# Patient Record
Sex: Female | Born: 1975 | Race: White | Hispanic: No | Marital: Married | Smoking: Never smoker
Health system: Southern US, Community
[De-identification: ages and names within clinical notes are randomized; demographics above are authoritative.]

## PROBLEM LIST (undated history)

## (undated) ENCOUNTER — Emergency Department (HOSPITAL_COMMUNITY): Payer: Self-pay

## (undated) DIAGNOSIS — D219 Benign neoplasm of connective and other soft tissue, unspecified: Secondary | ICD-10-CM

## (undated) DIAGNOSIS — I499 Cardiac arrhythmia, unspecified: Secondary | ICD-10-CM

## (undated) DIAGNOSIS — A64 Unspecified sexually transmitted disease: Secondary | ICD-10-CM

## (undated) DIAGNOSIS — M797 Fibromyalgia: Secondary | ICD-10-CM

## (undated) DIAGNOSIS — L509 Urticaria, unspecified: Secondary | ICD-10-CM

## (undated) DIAGNOSIS — R519 Headache, unspecified: Secondary | ICD-10-CM

## (undated) DIAGNOSIS — N946 Dysmenorrhea, unspecified: Secondary | ICD-10-CM

## (undated) DIAGNOSIS — G8929 Other chronic pain: Secondary | ICD-10-CM

## (undated) HISTORY — DX: Fibromyalgia: M79.7

## (undated) HISTORY — DX: Unspecified sexually transmitted disease: A64

## (undated) HISTORY — DX: Benign neoplasm of connective and other soft tissue, unspecified: D21.9

## (undated) HISTORY — DX: Cardiac arrhythmia, unspecified: I49.9

## (undated) HISTORY — DX: Dysmenorrhea, unspecified: N94.6

## (undated) HISTORY — PX: TUBAL LIGATION: SHX77

## (undated) HISTORY — PX: BREAST BIOPSY: SHX20

## (undated) HISTORY — DX: Urticaria, unspecified: L50.9

---

## 2001-09-21 ENCOUNTER — Inpatient Hospital Stay (HOSPITAL_COMMUNITY): Admission: AD | Admit: 2001-09-21 | Discharge: 2001-09-21 | Payer: Self-pay | Admitting: Obstetrics and Gynecology

## 2001-11-17 ENCOUNTER — Inpatient Hospital Stay (HOSPITAL_COMMUNITY): Admission: AD | Admit: 2001-11-17 | Discharge: 2001-11-20 | Payer: Self-pay | Admitting: Obstetrics and Gynecology

## 2001-12-29 ENCOUNTER — Other Ambulatory Visit: Admission: RE | Admit: 2001-12-29 | Discharge: 2001-12-29 | Payer: Self-pay | Admitting: Obstetrics and Gynecology

## 2003-04-09 ENCOUNTER — Emergency Department (HOSPITAL_COMMUNITY): Admission: EM | Admit: 2003-04-09 | Discharge: 2003-04-09 | Payer: Self-pay

## 2008-12-09 DIAGNOSIS — Z8742 Personal history of other diseases of the female genital tract: Secondary | ICD-10-CM

## 2008-12-09 HISTORY — DX: Personal history of other diseases of the female genital tract: Z87.42

## 2018-12-09 HISTORY — PX: OTHER SURGICAL HISTORY: SHX169

## 2020-12-28 ENCOUNTER — Other Ambulatory Visit: Payer: Self-pay

## 2020-12-28 ENCOUNTER — Ambulatory Visit (INDEPENDENT_AMBULATORY_CARE_PROVIDER_SITE_OTHER): Payer: BC Managed Care – PPO | Admitting: Obstetrics and Gynecology

## 2020-12-28 ENCOUNTER — Encounter: Payer: Self-pay | Admitting: Obstetrics and Gynecology

## 2020-12-28 VITALS — BP 122/82 | HR 82 | Ht 64.0 in | Wt 172.0 lb

## 2020-12-28 DIAGNOSIS — D259 Leiomyoma of uterus, unspecified: Secondary | ICD-10-CM

## 2020-12-28 NOTE — Patient Instructions (Addendum)
Total Laparoscopic Hysterectomy A total laparoscopic hysterectomy is a minimally invasive surgery to remove the uterus and cervix. The fallopian tubes and ovaries can also be removed during this surgery, if necessary. This procedure may be done to treat problems such as:  Growths in the uterus (uterine fibroids) that are not cancer but cause symptoms.  A condition that causes the lining of the uterus to grow in other areas (endometriosis).  Problems with pelvic support.  Cancer of the cervix, ovaries, uterus, or tissue that lines the uterus (endometrium).  Excessive bleeding in the uterus. After this procedure, you will no longer be able to have a baby, and you will no longer have a menstrual period. Tell a health care provider about:  Any allergies you have.  All medicines you are taking, including vitamins, herbs, eye drops, creams, and over-the-counter medicines.  Any problems you or family members have had with anesthetic medicines.  Any blood disorders you have.  Any surgeries you have had.  Any medical conditions you have.  Whether you are pregnant or may be pregnant. What are the risks? Generally, this is a safe procedure. However, problems may occur, including:  Infection.  Bleeding.  Blood clots in the legs or lungs.  Allergic reactions to medicines.  Damage to nearby structures or organs.  Having to change from this surgery to one in which a large incision is made in the abdomen (abdominal hysterectomy). What happens before the procedure? Staying hydrated Follow instructions from your health care provider about hydration, which may include:  Up to 2 hours before the procedure - you may continue to drink clear liquids, such as water, clear fruit juice, black coffee, and plain tea.   Eating and drinking restrictions Follow instructions from your health care provider about eating and drinking, which may include:  8 hours before the procedure - stop eating  heavy meals or foods, such as meat, fried foods, or fatty foods.  6 hours before the procedure - stop eating light meals or foods, such as toast or cereal.  6 hours before the procedure - stop drinking milk or drinks that contain milk.  2 hours before the procedure - stop drinking clear liquids. Medicines  Ask your health care provider about: ? Changing or stopping your regular medicines. This is especially important if you are taking diabetes medicines or blood thinners. ? Taking medicines such as aspirin and ibuprofen. These medicines can thin your blood. Do not take these medicines unless your health care provider tells you to take them. ? Taking over-the-counter medicines, vitamins, herbs, and supplements.  You may be asked to take medicine that helps you have a bowel movement (laxative) to prevent constipation. General instructions  If you were asked to do bowel preparation before the procedure, follow instructions from your health care provider.  This procedure can affect the way you feel about yourself. Talk with your health care provider about the physical and emotional changes hysterectomy may cause.  Do not use any products that contain nicotine or tobacco for at least 4 weeks before the procedure. These products include cigarettes, chewing tobacco, and vaping devices, such as e-cigarettes. If you need help quitting, ask your health care provider.  Plan to have a responsible adult take you home from the hospital or clinic.  Plan to have a responsible adult care for you for the time you are told after you leave the hospital or clinic. This is important. Surgery safety Ask your health care provider:  How your surgery  site will be marked.  What steps will be taken to help prevent infection. These may include: ? Removing hair at the surgery site. ? Washing skin with a germ-killing soap. ? Receiving antibiotic medicine. What happens during the procedure?  An IV will be  inserted into one of your veins.  You will be given one or more of the following: ? A medicine to help you relax (sedative). ? A medicine to make you fall asleep (general anesthetic). ? A medicine to numb the area (local anesthetic). ? A medicine that is injected into your spine to numb the area below and slightly above the injection site (spinal anesthetic). ? A medicine that is injected into an area of your body to numb everything below the injection site (regional anesthetic).  A gas will be used to inflate your abdomen. This will allow your surgeon to look inside your abdomen and do the surgery.  Three or four small incisions will be made in your abdomen.  A small device with a light (laparoscope) will be inserted into one of your incisions. Surgical instruments will be inserted through the other incisions in order to perform the procedure.  Your uterus and cervix may be removed through your vagina or cut into small pieces and removed through the small incisions. Any other organs that need to be removed will also be removed this way.  The gas will be released from inside your abdomen.  Your incisions will be closed with stitches (sutures), skin glue, or adhesive strips.  A bandage (dressing) may be placed over your incisions. The procedure may vary among health care providers and hospitals. What happens after the procedure?  Your blood pressure, heart rate, breathing rate, and blood oxygen level will be monitored until you leave the hospital or clinic.  You will be given medicine for pain as needed.  You will be encouraged to walk as soon as possible. You will also use a device to help you breathe or do breathing exercises to keep your lungs clear.  You may have to wear compression stockings. These stockings help to prevent blood clots and reduce swelling in your legs.  You will need to wear a sanitary pad for vaginal discharge or bleeding. Summary  Total laparoscopic  hysterectomy is a procedure to remove your uterus, cervix, and sometimes the fallopian tubes and ovaries.  This procedure can affect the way you feel about yourself. Talk with your health care provider about the physical and emotional changes hysterectomy may cause.  After this procedure, you will no longer be able to have a baby, and you will no longer have a menstrual period.  You will be given pain medicine to control discomfort after this procedure.  Plan to have a responsible adult take you home from the hospital or clinic. This information is not intended to replace advice given to you by your health care provider. Make sure you discuss any questions you have with your health care provider. Document Revised: 07/28/2020 Document Reviewed: 07/28/2020 Elsevier Patient Education  2021 Elsevier Inc. Uterine Fibroids  Uterine fibroids, also called leiomyomas, are noncancerous (benign) tumors that can grow in the uterus. They can cause heavy menstrual bleeding and pain. Fibroids may also grow in the fallopian tubes, cervix, or tissues (ligaments) near the uterus. You may have one or many fibroids. Fibroids vary in size, weight, and where they grow in the uterus. Some can become quite large. Most fibroids do not require medical treatment. What are the causes? The cause   of this condition is not known. What increases the risk? You are more likely to develop this condition if you:  Are in your 30s or 40s and have not gone through menopause.  Have a family history of this condition.  Are of African American descent.  Started your menstrual period at age 10 or younger.  Have never given birth.  Are overweight or obese. What are the signs or symptoms? Many women do not have any symptoms. Symptoms of this condition may include:  Heavy menstrual bleeding.  Bleeding between menstrual periods.  Pain and pressure in the pelvic area, between your hip bones.  Pain during sex.  Bladder  problems, such as needing to urinate right away or more often than usual.  Inability to have children (infertility).  Failure to carry pregnancy to term (miscarriage). How is this diagnosed? This condition may be diagnosed based on:  Your symptoms and medical history.  A physical exam.  A pelvic exam that includes feeling for any tumors.  Imaging tests, such as ultrasound or MRI. How is this treated? Treatment for this condition may include follow-up visits with your health care provider to monitor your fibroids for any changes. Other treatment may include:  Medicines, such as: ? Medicines to relieve pain, including aspirin and NSAIDs, such as ibuprofen or naproxen. ? Hormone therapy. Treatment may be given as a pill or an injection, or it may be inserted into the uterus using an intrauterine device (IUD).  Surgery that would do one of the following: ? Remove the fibroids (myomectomy). This may be recommended if fibroids affect your fertility and you want to become pregnant. ? Remove the uterus (hysterectomy). ? Block the blood supply to the fibroids (uterine artery embolization). This can cause them to shrink and die. Follow these instructions at home: Medicines  Take over-the-counter and prescription medicines only as told by your health care provider.  Ask your health care provider if you should take iron pills or eat more iron-rich foods, such as dark green, leafy vegetables. Heavy menstrual bleeding can cause low iron levels. Managing pain If directed, apply heat to your back or abdomen to reduce pain. Use the heat source that your health care provider recommends, such as a moist heat pack or a heating pad. To apply heat:  Place a towel between your skin and the heat source.  Leave the heat on for 20-30 minutes.  Remove the heat if your skin turns bright red. This is especially important if you are unable to feel pain, heat, or cold. You may have a greater risk of getting  burned.   General instructions  Pay close attention to your menstrual cycle. Tell your health care provider about any changes, such as: ? Heavier bleeding that requires you to change your pads or tampons more than usual. ? A change in the number of days that your menstrual period lasts. ? A change in symptoms that come with your menstrual period, such as back pain or cramps in your abdomen.  Keep all follow-up visits. This is important, especially if your fibroids need to be monitored for any changes. Contact a health care provider if you:  Have pelvic pain, back pain, or cramps in your abdomen that do not get better with medicine or heat.  Develop new bleeding between menstrual periods.  Have increased bleeding during or between menstrual periods.  Feel more tired or weak than usual.  Feel light-headed. Get help right away if you:  Faint.  Have pelvic   pain that suddenly gets worse.  Have severe vaginal bleeding that soaks a tampon or pad in 30 minutes or less. Summary  Uterine fibroids are noncancerous (benign) tumors that can develop in the uterus.  The exact cause of this condition is not known.  Most fibroids do not require medical treatment unless they affect your ability to have children (fertility).  Contact a health care provider if you have pelvic pain, back pain, or cramps in your abdomen that do not get better with medicines.  Get help right away if you faint, have pelvic pain that suddenly gets worse, or have severe vaginal bleeding. This information is not intended to replace advice given to you by your health care provider. Make sure you discuss any questions you have with your health care provider. Document Revised: 06/27/2020 Document Reviewed: 06/27/2020 Elsevier Patient Education  2021 Elsevier Inc.  

## 2020-12-28 NOTE — Progress Notes (Signed)
GYNECOLOGY  VISIT   HPI: 45 y.o.   Married  Caucasian  female   G3P3 with Patient's last menstrual period was 11/30/2020 (approximate).   here for patient here for heavy menstrual cycles and to evaluate fibroids.  She has a CT scan showing fibroids.  CT ordered for gastrointestinal issues.  Had a negative EMB. States they feel like they are getting bigger.  She has abdominal sensitivity extending into her back, bladder, rectum.   Menses monthly, heavy, cramping, and last 7 days. No intermenstrual bleeding.  No history of anemia.  Declines future childbearing. She was scheduled for a hysterectomy in [place] but moved to [place]. Still considering this surgery.  GYNECOLOGIC HISTORY: Patient's last menstrual period was 11/30/2020 (approximate). Contraception:  tubal Menopausal hormone therapy: n/a Last mammogram: 10-12 years ago had abnormal MMG in [place] with BX showing calcifications.  Mammogram in Jan or Feb, 2021, and this was normal. Last pap smear: 2020 normal and negative HR HPV - per patient. 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          OB History    Gravida  3   Para  3   Term      Preterm      AB      Living  3     SAB      IAB      Ectopic      Multiple      Live Births                 There are no problems to display for this patient.   Past Medical History:  Diagnosis Date  . Dysmenorrhea   . Fibroid   . Fibromyalgia   . History of abnormal cervical Pap smear 2010   hx of colposcopy--no treatment  . Irregular heart rhythm    takes propranolol  . STD (sexually transmitted disease)    Hx of HPV    Past Surgical History:  Procedure Laterality Date  . COLPOSCOPY    . TUBAL LIGATION      Current Outpatient Medications  Medication Sig Dispense Refill  . gabapentin (NEURONTIN) 400 MG capsule Take 400 mg by mouth daily.    . propranolol (INDERAL) 20 MG tablet Take 20 mg by mouth 3 (three) times daily.     No  current facility-administered medications for this visit.     ALLERGIES: Patient has no known allergies.  Family History  Problem Relation Age of Onset  . Diabetes Maternal Grandfather   . Stroke Paternal Grandfather     Social History   Socioeconomic History  . Marital status: Married    Spouse name: Not on file  . Number of children: Not on file  . Years of education: Not on file  . Highest education level: Not on file  Occupational History  . Not on file  Tobacco Use  . Smoking status: Never Smoker  . Smokeless tobacco: Never Used  Vaping Use  . Vaping Use: Never used  Substance and Sexual Activity  . Alcohol use: Not Currently  . Drug use: Never  . Sexual activity: Yes    Birth control/protection: Surgical    Comment: Tubal`  Other Topics Concern  . Not on file  Social History Narrative  . Not on file   Social Determinants of Health   Financial Resource Strain: Not on file  Food Insecurity: Not on file  Transportation Needs: Not on file  Physical  Activity: Not on file  Stress: Not on file  Social Connections: Not on file  Intimate Partner Violence: Not on file    Review of Systems  All other systems reviewed and are negative.   PHYSICAL EXAMINATION:    BP 122/82   Pulse 82   Ht 5\' 4"  (1.626 m)   Wt 172 lb (78 kg)   LMP 11/30/2020 (Approximate)   SpO2 98%   BMI 29.52 kg/m     General appearance: alert, cooperative and appears stated age Head: Normocephalic, without obvious abnormality, atraumatic Lungs: clear to auscultation bilaterally Heart: regular rate and rhythm Abdomen: soft, non-tender,16 week size mass.  Extremities: extremities normal, atraumatic, no cyanosis or edema Skin: Skin color, texture, turgor normal. No rashes or lesions No abnormal inguinal nodes palpated Neurologic: Grossly normal  Pelvic: External genitalia:  no lesions              Urethra:  normal appearing urethra with no masses, tenderness or lesions               Bartholins and Skenes: normal                 Vagina: normal appearing vagina with normal color and discharge, no lesions              Cervix: no lesions                Bimanual Exam:  Uterus:  16 week size uterus, nontender.               Adnexa: no mass, fullness, tenderness              Rectal exam: Yes.  .  Confirms.              Anus:  normal sphincter tone, no lesions  Chaperone was present for exam.  ASSESSMENT  Fibroid uterus.  Hx prior positive HR HPV.  Last pap normal.  Status post BTL.  PLAN  We discussed fibroids and treatment options - Medical therapy to control cycles, uterine artery embolization, laparoscopic hysterectomy.  Will get a copy of her CT scan, last pap and endometrial biopsy.  Return for pelvic ultrasound.

## 2021-01-12 ENCOUNTER — Other Ambulatory Visit: Payer: Self-pay | Admitting: *Deleted

## 2021-01-12 DIAGNOSIS — D259 Leiomyoma of uterus, unspecified: Secondary | ICD-10-CM

## 2021-02-08 ENCOUNTER — Other Ambulatory Visit: Payer: BC Managed Care – PPO

## 2021-02-08 ENCOUNTER — Other Ambulatory Visit: Payer: BC Managed Care – PPO | Admitting: Obstetrics and Gynecology

## 2021-02-27 NOTE — Progress Notes (Signed)
GYNECOLOGY  VISIT   HPI: 45 y.o.   Married  Caucasian  female   G3P3 with Patient's last menstrual period was 02/18/2021 (exact date).   here for pelvic ultrasound follow up of fibroid and menorrhagia with regular menses and dysmenorrhea.  CT scan 06/04/19 showing 5.3 cm fibroid. [facility] EMB 11/29/19 benign proliferative endometirum with no atypia of hyperplasia. Peak Enigma.  Uterus felt 16 week size on exam on 12/28/20 in office here.   Feels tired all the time. States she does has fibromyalgia.  No dx of anemia.  Declines future childbearing.  Wants hysterectomy.   GYNECOLOGIC HISTORY: Patient's last menstrual period was 02/18/2021 (exact date). Contraception:  Tubal Menopausal hormone therapy:  n/a Last mammogram:  10-12 years ago had abnormal MMG in [place] with BX showing calcifications.  Mammogram in Jan or Feb, 2021, and this was normal. Last pap smear:   2020 normal and negative HR HPV - per patient. 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          OB History    Gravida  3   Para  3   Term      Preterm      AB      Living  3     SAB      IAB      Ectopic      Multiple      Live Births                 There are no problems to display for this patient.   Past Medical History:  Diagnosis Date  . Dysmenorrhea   . Fibroid   . Fibromyalgia   . History of abnormal cervical Pap smear 2010   hx of colposcopy--no treatment  . Irregular heart rhythm    takes propranolol  . STD (sexually transmitted disease)    Hx of HPV    Past Surgical History:  Procedure Laterality Date  . COLPOSCOPY    . TUBAL LIGATION      Current Outpatient Medications  Medication Sig Dispense Refill  . gabapentin (NEURONTIN) 400 MG capsule Take 400 mg by mouth daily.    . propranolol (INDERAL) 20 MG tablet Take 20 mg by mouth 3 (three) times daily.     No current facility-administered medications for this visit.      ALLERGIES: Patient has no known allergies.  Family History  Problem Relation Age of Onset  . Diabetes Maternal Grandfather   . Stroke Paternal Grandfather     Social History   Socioeconomic History  . Marital status: Married    Spouse name: Not on file  . Number of children: Not on file  . Years of education: Not on file  . Highest education level: Not on file  Occupational History  . Not on file  Tobacco Use  . Smoking status: Never Smoker  . Smokeless tobacco: Never Used  Vaping Use  . Vaping Use: Never used  Substance and Sexual Activity  . Alcohol use: Not Currently  . Drug use: Never  . Sexual activity: Yes    Birth control/protection: Surgical    Comment: Tubal`  Other Topics Concern  . Not on file  Social History Narrative  . Not on file   Social Determinants of Health   Financial Resource Strain: Not on file  Food Insecurity: Not on file  Transportation Needs: Not on file  Physical Activity: Not on  file  Stress: Not on file  Social Connections: Not on file  Intimate Partner Violence: Not on file    Review of Systems  All other systems reviewed and are negative.   PHYSICAL EXAMINATION:    BP 108/70 (Cuff Size: Large)   Pulse 75   Ht 5\' 4"  (1.626 m)   Wt 172 lb (78 kg)   LMP 02/18/2021 (Exact Date)   SpO2 98%   BMI 29.52 kg/m     General appearance: alert, cooperative and appears stated age  Pelvic US Uterus retroverted. Fibroids - 6.53 cm left upper, 1.72 cm, and 0.81 cm.  EMS 8.63 mm. Ovaries normal with normal follicle pattern.  No adnexal masses.  No free fluid.   ASSESSMENT  Fibroids.  Fatigue.    PLAN  US findings and images discussed.   CBC and TSH.   Fibroids treatment options reviewed - hormonal contraceptives, Freida Busman, Depo Lupron, hysteroscopic and laparoscopic ultrasound guided ablation, myomectomy, uterine artery embolization and hysterectomy reviewed.  I discussed total laparoscopic hysterectomy with bilateral  salpingectomy and possible bilateral oophorectomy (if abnormal), vaginal morcellation of uterus in an Alexis bag, and cystoscopy.   I reviewed risks, benefits, and alternatives.  Risks include but are not limited to bleeding, infection, damage to surrounding organs, pneumonia, reaction to anesthesia, DVT, PE, death, need for reoperation, hernia formation, vaginal cuff dehiscence, neuropathy, need to convert to a traditional laparotomy incision to complete the procedure. Surgical expectations and recovery discussed.  She will receive Lovenox for DVT/PE prophylaxis. Patient wishes to proceed.  Questions invited and answered.  ACOG HO on hysterectomy.   She will update her mammogram.    35 min  total time was spent for this patient encounter, including preparation, face-to-face counseling with the patient, coordination of care, and documentation of the encounter.

## 2021-03-01 ENCOUNTER — Ambulatory Visit (INDEPENDENT_AMBULATORY_CARE_PROVIDER_SITE_OTHER): Payer: BC Managed Care – PPO | Admitting: Obstetrics and Gynecology

## 2021-03-01 ENCOUNTER — Ambulatory Visit (INDEPENDENT_AMBULATORY_CARE_PROVIDER_SITE_OTHER): Payer: BC Managed Care – PPO

## 2021-03-01 ENCOUNTER — Encounter: Payer: Self-pay | Admitting: Obstetrics and Gynecology

## 2021-03-01 ENCOUNTER — Telehealth: Payer: Self-pay | Admitting: Obstetrics and Gynecology

## 2021-03-01 ENCOUNTER — Other Ambulatory Visit: Payer: Self-pay

## 2021-03-01 VITALS — BP 108/70 | HR 75 | Ht 64.0 in | Wt 172.0 lb

## 2021-03-01 DIAGNOSIS — D259 Leiomyoma of uterus, unspecified: Secondary | ICD-10-CM | POA: Diagnosis not present

## 2021-03-01 DIAGNOSIS — R5383 Other fatigue: Secondary | ICD-10-CM

## 2021-03-01 DIAGNOSIS — D219 Benign neoplasm of connective and other soft tissue, unspecified: Secondary | ICD-10-CM

## 2021-03-01 LAB — CBC
HCT: 39.4 % (ref 35.0–45.0)
Hemoglobin: 13.1 g/dL (ref 11.7–15.5)
MCH: 29.6 pg (ref 27.0–33.0)
MCHC: 33.2 g/dL (ref 32.0–36.0)
MCV: 89.1 fL (ref 80.0–100.0)
MPV: 10.3 fL (ref 7.5–12.5)
Platelets: 377 10*3/uL (ref 140–400)
RBC: 4.42 10*6/uL (ref 3.80–5.10)
RDW: 12.1 % (ref 11.0–15.0)
WBC: 5.5 10*3/uL (ref 3.8–10.8)

## 2021-03-01 LAB — TSH: TSH: 1.02 mIU/L

## 2021-03-01 NOTE — Telephone Encounter (Signed)
Please precert and schedule surgery.  My patient will have a total laparoscopic hysterectomy with bilateral salpingectomy, possible bilateral oophorectomy, vaginal morcellation of uterus in an Alexis bag, and cystoscopy.   She has fibroids.

## 2021-03-01 NOTE — Telephone Encounter (Signed)
Spoke with patient, reviewed surgery dates and Covid testing and quarantine requirements.  Patient request to proceed with surgery on 03/20/21 at [facility].  Advised once surgery date is confirmed I will return call. Patient agreeable.   Routing to [employer] for [employer].

## 2021-03-02 NOTE — Telephone Encounter (Signed)
Spoke with patient regarding surgery benefits. Patient acknowledges understanding of information presented. Patient is aware that benefits presented are professional benefits only. Patient is aware that once surgery is scheduled, the hospital will call with separate benefits. See account note.  Routing to Jill Hamm, RN, for surgery scheduling. 

## 2021-03-02 NOTE — Telephone Encounter (Signed)
Spoke with patient. Surgery date request confirmed.  Advised surgery is scheduled for 03/20/21, [facility] at [facility]. Surgery instruction sheet and hospital brochure reviewed, printed copy will be mailed.  Patient advised of Covid screening and quarantine requirements and agreeable.   Routing to provider. Encounter closed.   Cc: Hayley Carder

## 2021-03-12 ENCOUNTER — Other Ambulatory Visit: Payer: Self-pay | Admitting: Obstetrics and Gynecology

## 2021-03-12 DIAGNOSIS — Z1231 Encounter for screening mammogram for malignant neoplasm of breast: Secondary | ICD-10-CM

## 2021-03-13 ENCOUNTER — Encounter (HOSPITAL_BASED_OUTPATIENT_CLINIC_OR_DEPARTMENT_OTHER): Payer: Self-pay | Admitting: Obstetrics and Gynecology

## 2021-03-13 ENCOUNTER — Other Ambulatory Visit: Payer: Self-pay

## 2021-03-13 NOTE — Progress Notes (Signed)
YOU ARE SCHEDULED FOR A COVID TEST 03-16-2021 @ 225 PM. THIS TEST MUST BE DONE BEFORE SURGERY. GO TO  [place]. [place], [place], IT IS APPROXIMATELY 2 MINUTES PAST [employer] ON THE RIGHT AND REMAIN IN YOUR CAR, THIS IS A DRIVE UP TEST. ONCE YOUR COVID TEST IS DONE PLEASE FOLLOW ALL THE QUARANTINE  INSTRUCTIONS GIVEN IN YOUR HANDOUT.      Your procedure is scheduled on 03-20-2021  Report to [facility].   Call this number if you have problems the morning of surgery  :[phone].   OUR ADDRESS IS [address].  WE ARE LOCATED IN [facility].  PLEASE BRING YOUR INSURANCE CARD AND PHOTO ID DAY OF SURGERY.  ONLY ONE PERSON ALLOWED IN FACILITY WAITING AREA.                                     REMEMBER: NO SOLID FOOD AFTER MIDNIGHT THE NIGHT PRIOR TO SURGERY. NOTHING BY MOUTH EXCEPT CLEAR LIQUIDS UNTIL 430 AM . PLEASE FINISH ENSURE DRINK PER SURGEON ORDER  WHICH NEEDS TO BE COMPLETED AT 430 AM .   DO NOT EAT FOOD, CANDY GUM OR MINTS  AFTER MIDNIGHT . YOU MAY HAVE CLEAR LIQUIDS FROM MIDNIGHT UNTIL 430 AM. NO CLEAR LIQUIDS AFTER 430 AM DAY OF SURGERY.   YOU MAY  BRUSH YOUR TEETH MORNING OF SURGERY AND RINSE YOUR MOUTH OUT, NO CHEWING GUM CANDY OR MINTS.    CLEAR LIQUID DIET   Foods Allowed                                                                     Foods Excluded  Coffee and tea, regular and decaf                             liquids that you cannot  Plain Jell-O any favor except red or purple                                           see through such as: Fruit ices (not with fruit pulp)                                     milk, soups, orange juice  Iced Popsicles                                    All solid food Carbonated beverages, regular and diet                                    Cranberry, grape and apple juices Sports drinks like Gatorade Lightly seasoned clear broth or consume(fat free) Sugar, honey  syrup  Sample Menu Breakfast  Lunch                                     Supper Cranberry juice                    Beef broth                            Chicken broth Jell-O                                     Grape juice                           Apple juice Coffee or tea                        Jell-O                                      Popsicle                                                Coffee or tea                        Coffee or tea  _____________________________________________________________________     TAKE THESE MEDICATIONS MORNING OF SURGERY WITH A SIP OF WATER: PROPRANOLOL.  ONE VISITOR IS ALLOWED IN WAITING ROOM ONLY DAY OF SURGERY.  NO VISITOR MAY SPEND THE NIGHT.  VISITOR ARE ALLOWED TO STAY UNTIL 800 PM.                                    DO NOT WEAR JEWERLY, MAKE UP. DO NOT WEAR LOTIONS, POWDERS, PERFUMES OR DEODORANT. DO NOT SHAVE FOR 24 HOURS PRIOR TO DAY OF SURGERY. MEN MAY SHAVE FACE AND NECK. CONTACTS, GLASSES, OR DENTURES MAY NOT BE WORN TO SURGERY.                                    [name] IS NOT RESPONSIBLE  FOR ANY BELONGINGS.                                                                    Brooke Kitchen           [name] - Preparing for Surgery Before surgery, you can play an important role.  Because skin is not sterile, your skin needs to be as free of germs as possible.  You can reduce the number of germs on your skin by washing with CHG (chlorahexidine gluconate) soap before surgery.  CHG is an antiseptic cleaner which  kills germs and bonds with the skin to continue killing germs even after washing. Please DO NOT use if you have an allergy to CHG or antibacterial soaps.  If your skin becomes reddened/irritated stop using the CHG and inform your nurse when you arrive at Short Stay. Do not shave (including legs and underarms) for at least 48 hours prior to the first CHG shower.  You may shave your face/neck. Please follow these  instructions carefully:  1.  Shower with CHG Soap the night before surgery and the  morning of Surgery.  2.  If you choose to wash your hair, wash your hair first as usual with your  normal  shampoo.  3.  After you shampoo, rinse your hair and body thoroughly to remove the  shampoo.                            4.  Use CHG as you would any other liquid soap.  You can apply chg directly  to the skin and wash                      Gently with a scrungie or clean washcloth.  5.  Apply the CHG Soap to your body ONLY FROM THE NECK DOWN.   Do not use on face/ open                           Wound or open sores. Avoid contact with eyes, ears mouth and genitals (private parts).                       Wash face,  Genitals (private parts) with your normal soap.             6.  Wash thoroughly, paying special attention to the area where your surgery  will be performed.  7.  Thoroughly rinse your body with warm water from the neck down.  8.  DO NOT shower/wash with your normal soap after using and rinsing off  the CHG Soap.                9.  Pat yourself dry with a clean towel.            10.  Wear clean pajamas.            11.  Place clean sheets on your bed the night of your first shower and do not  sleep with pets. Day of Surgery : Do not apply any lotions/deodorants the morning of surgery.  Please wear clean clothes to the hospital/surgery center.  FAILURE TO FOLLOW THESE INSTRUCTIONS MAY RESULT IN THE CANCELLATION OF YOUR SURGERY PATIENT SIGNATURE_________________________________  NURSE SIGNATURE__________________________________  ________________________________________________________________________                                                        QUESTIONS Brooke [name] PRE OP NURSE PHONE [phone]

## 2021-03-13 NOTE — Progress Notes (Addendum)
Spoke w/ via phone for pre-op interview---pt Lab needs dos----cbc bmp ekg t & s lab appt 4-8- -2022 115 pm                Lab results------none COVID test ------03-16-2021 225 pm Arrive at -------530 am NPO after MN NO Solid Food.  Clear liquids from MN until---430 am then npo, drink ensure presurgery drink at 430 am then npo Med rec completed Medications to take morning of surgery -----propranolol Diabetic medication -----n/a Patient instructed to bring photo id and insurance card day of surgery Patient aware to have Driver (ride ) / caregiver spouse brandon will stay    for 24 hours after surgery  Patient Special Instructions -----none Pre-Op special Istructions -----none Patient verbalized understanding of instructions that were given at this phone interview. Patient denies shortness of breath, chest pain, fever, cough at this phone interview.  Pt states taking propranolol for irregular heart beat worked up by cardiology in [place] approximately 4 years ago and primary md manages propranol now no current cardiologist needed per pt.

## 2021-03-15 ENCOUNTER — Encounter (HOSPITAL_BASED_OUTPATIENT_CLINIC_OR_DEPARTMENT_OTHER): Payer: Self-pay | Admitting: Obstetrics and Gynecology

## 2021-03-16 ENCOUNTER — Other Ambulatory Visit (HOSPITAL_COMMUNITY)
Admission: RE | Admit: 2021-03-16 | Discharge: 2021-03-16 | Disposition: A | Discharge status: Home / Self Care | Payer: BC Managed Care – PPO | Source: Ambulatory Visit | Attending: Obstetrics and Gynecology | Admitting: Obstetrics and Gynecology

## 2021-03-16 ENCOUNTER — Other Ambulatory Visit: Payer: Self-pay

## 2021-03-16 ENCOUNTER — Encounter (HOSPITAL_COMMUNITY)
Admission: RE | Admit: 2021-03-16 | Discharge: 2021-03-16 | Disposition: A | Discharge status: Home / Self Care | Payer: BC Managed Care – PPO | Source: Ambulatory Visit | Attending: Obstetrics and Gynecology | Admitting: Obstetrics and Gynecology

## 2021-03-16 DIAGNOSIS — Z01818 Encounter for other preprocedural examination: Secondary | ICD-10-CM | POA: Diagnosis not present

## 2021-03-16 DIAGNOSIS — Z20822 Contact with and (suspected) exposure to covid-19: Secondary | ICD-10-CM | POA: Diagnosis not present

## 2021-03-16 LAB — CBC
HCT: 41.4 % (ref 36.0–46.0)
Hemoglobin: 13.7 g/dL (ref 12.0–15.0)
MCH: 30 pg (ref 26.0–34.0)
MCHC: 33.1 g/dL (ref 30.0–36.0)
MCV: 90.8 fL (ref 80.0–100.0)
Platelets: 350 10*3/uL (ref 150–400)
RBC: 4.56 MIL/uL (ref 3.87–5.11)
RDW: 12 % (ref 11.5–15.5)
WBC: 7.5 10*3/uL (ref 4.0–10.5)
nRBC: 0 % (ref 0.0–0.2)

## 2021-03-16 LAB — BASIC METABOLIC PANEL
Anion gap: 7 (ref 5–15)
BUN: 10 mg/dL (ref 6–20)
CO2: 26 mmol/L (ref 22–32)
Calcium: 9 mg/dL (ref 8.9–10.3)
Chloride: 108 mmol/L (ref 98–111)
Creatinine, Ser: 0.72 mg/dL (ref 0.44–1.00)
GFR, Estimated: >60 mL/min (ref >60)
Glucose, Bld: 90 mg/dL (ref 70–99)
Potassium: 3.7 mmol/L (ref 3.5–5.1)
Sodium: 141 mmol/L (ref 135–145)

## 2021-03-16 LAB — SARS CORONAVIRUS 2 (TAT 6-24 HRS): SARS Coronavirus 2: NEGATIVE

## 2021-03-19 NOTE — Anesthesia Preprocedure Evaluation (Addendum)
Anesthesia Evaluation  Patient identified by MRN, date of birth, ID band Patient awake    Reviewed: Allergy & Precautions, NPO status , Patient's Chart, lab work & pertinent test results  Airway Mallampati: II  TM Distance: >3 FB Neck ROM: Full    Dental  (+) Dental Advisory Given   Pulmonary neg pulmonary ROS,    breath sounds clear to auscultation       Cardiovascular + dysrhythmias (Sinus Tach on propranolol)  Rhythm:Regular Rate:Normal     Neuro/Psych negative neurological ROS     GI/Hepatic negative GI ROS, Neg liver ROS,   Endo/Other  negative endocrine ROS  Renal/GU negative Renal ROS     Musculoskeletal  (+) Fibromyalgia -  Abdominal   Peds  Hematology negative hematology ROS (+)   Anesthesia Other Findings Cracked top molar, chipped, jagged front teeth--dental advisory given preop.  Reproductive/Obstetrics                           Lab Results  Component Value Date   WBC 7.5 03/16/2021   HGB 13.7 03/16/2021   HCT 41.4 03/16/2021   MCV 90.8 03/16/2021   PLT 350 03/16/2021   Lab Results  Component Value Date   CREATININE 0.72 03/16/2021   BUN 10 03/16/2021   NA 141 03/16/2021   K 3.7 03/16/2021   CL 108 03/16/2021   CO2 26 03/16/2021    Anesthesia Physical Anesthesia Plan  ASA: II  Anesthesia Plan: General   Post-op Pain Management:    Induction: Intravenous  PONV Risk Score and Plan: 4 or greater and Scopolamine patch - Pre-op, Midazolam, Dexamethasone, Ondansetron and Treatment may vary due to age or medical condition  Airway Management Planned: Oral ETT  Additional Equipment: None  Intra-op Plan:   Post-operative Plan: Extubation in OR  Informed Consent: I have reviewed the patients History and Physical, chart, labs and discussed the procedure including the risks, benefits and alternatives for the proposed anesthesia with the patient or authorized  representative who has indicated his/her understanding and acceptance.     Dental advisory given  Plan Discussed with:   Anesthesia Plan Comments:        Anesthesia Quick Evaluation

## 2021-03-20 ENCOUNTER — Encounter (HOSPITAL_BASED_OUTPATIENT_CLINIC_OR_DEPARTMENT_OTHER): Payer: Self-pay | Admitting: Obstetrics and Gynecology

## 2021-03-20 ENCOUNTER — Observation Stay (HOSPITAL_BASED_OUTPATIENT_CLINIC_OR_DEPARTMENT_OTHER)
Admission: RE | Admit: 2021-03-20 | Discharge: 2021-03-20 | Disposition: A | Discharge status: Home / Self Care | Payer: BC Managed Care – PPO | Attending: Obstetrics and Gynecology | Admitting: Obstetrics and Gynecology

## 2021-03-20 ENCOUNTER — Encounter (HOSPITAL_BASED_OUTPATIENT_CLINIC_OR_DEPARTMENT_OTHER)
Admission: RE | Disposition: A | Discharge status: Home / Self Care | Payer: Self-pay | Source: Home / Self Care | Attending: Obstetrics and Gynecology

## 2021-03-20 ENCOUNTER — Other Ambulatory Visit: Payer: Self-pay

## 2021-03-20 ENCOUNTER — Ambulatory Visit (HOSPITAL_BASED_OUTPATIENT_CLINIC_OR_DEPARTMENT_OTHER): Payer: BC Managed Care – PPO | Admitting: Anesthesiology

## 2021-03-20 DIAGNOSIS — D259 Leiomyoma of uterus, unspecified: Secondary | ICD-10-CM | POA: Diagnosis not present

## 2021-03-20 DIAGNOSIS — Z90710 Acquired absence of both cervix and uterus: Secondary | ICD-10-CM | POA: Diagnosis present

## 2021-03-20 DIAGNOSIS — N946 Dysmenorrhea, unspecified: Secondary | ICD-10-CM | POA: Insufficient documentation

## 2021-03-20 HISTORY — DX: Other chronic pain: G89.29

## 2021-03-20 HISTORY — PX: CYSTOSCOPY: SHX5120

## 2021-03-20 HISTORY — PX: TOTAL LAPAROSCOPIC HYSTERECTOMY WITH SALPINGECTOMY: SHX6742

## 2021-03-20 HISTORY — DX: Headache, unspecified: R51.9

## 2021-03-20 LAB — POCT PREGNANCY, URINE: Preg Test, Ur: NEGATIVE

## 2021-03-20 LAB — ABO/RH: ABO/RH(D): B NEG

## 2021-03-20 LAB — CBC
HCT: 33.6 % — ABNORMAL LOW (ref 36.0–46.0)
Hemoglobin: 11.1 g/dL — ABNORMAL LOW (ref 12.0–15.0)
MCH: 29.5 pg (ref 26.0–34.0)
MCHC: 33 g/dL (ref 30.0–36.0)
MCV: 89.4 fL (ref 80.0–100.0)
Platelets: 260 10*3/uL (ref 150–400)
RBC: 3.76 MIL/uL — ABNORMAL LOW (ref 3.87–5.11)
RDW: 12 % (ref 11.5–15.5)
WBC: 16.5 10*3/uL — ABNORMAL HIGH (ref 4.0–10.5)
nRBC: 0 % (ref 0.0–0.2)

## 2021-03-20 LAB — TYPE AND SCREEN
ABO/RH(D): B NEG
Antibody Screen: NEGATIVE

## 2021-03-20 SURGERY — HYSTERECTOMY, TOTAL, LAPAROSCOPIC, WITH SALPINGECTOMY
Anesthesia: General | Site: Bladder

## 2021-03-20 MED ORDER — EPHEDRINE SULFATE-NACL 50-0.9 MG/10ML-% IV SOSY
PREFILLED_SYRINGE | INTRAVENOUS | Status: DC | PRN
  Administered 2021-03-20: 10 mg via INTRAVENOUS

## 2021-03-20 MED ORDER — KETAMINE HCL 10 MG/ML IJ SOLN
INTRAMUSCULAR | Status: DC | PRN
  Administered 2021-03-20: 35 mg via INTRAVENOUS

## 2021-03-20 MED ORDER — ACETAMINOPHEN 500 MG PO TABS: 1000.0000 mg | ORAL_TABLET | Freq: Once | ORAL | Status: DC

## 2021-03-20 MED ORDER — FENTANYL CITRATE (PF) 250 MCG/5ML IJ SOLN
INTRAMUSCULAR | Status: AC
  Filled 2021-03-20: qty 5

## 2021-03-20 MED ORDER — LACTATED RINGERS IV SOLN: INTRAVENOUS | Status: DC

## 2021-03-20 MED ORDER — SODIUM CHLORIDE 0.9 % IV SOLN
INTRAVENOUS | Status: DC | PRN
  Administered 2021-03-20: 60 mL

## 2021-03-20 MED ORDER — SCOPOLAMINE 1 MG/3DAYS TD PT72
1.0000 | MEDICATED_PATCH | TRANSDERMAL | Status: DC
  Administered 2021-03-20: 1.5 mg via TRANSDERMAL

## 2021-03-20 MED ORDER — OXYCODONE-ACETAMINOPHEN 5-325 MG PO TABS
ORAL_TABLET | ORAL | Status: AC
  Filled 2021-03-20: qty 1

## 2021-03-20 MED ORDER — ROCURONIUM BROMIDE 10 MG/ML (PF) SYRINGE
PREFILLED_SYRINGE | INTRAVENOUS | Status: AC
  Filled 2021-03-20: qty 10

## 2021-03-20 MED ORDER — SODIUM CHLORIDE 0.9 % IV SOLN
2.0000 g | INTRAVENOUS | Status: AC
  Administered 2021-03-20: 2 g via INTRAVENOUS

## 2021-03-20 MED ORDER — ALBUMIN HUMAN 5 % IV SOLN: INTRAVENOUS | Status: DC | PRN

## 2021-03-20 MED ORDER — ONDANSETRON HCL 4 MG/2ML IJ SOLN
4.0000 mg | Freq: Four times a day (QID) | INTRAMUSCULAR | Status: DC | PRN
  Administered 2021-03-20: 4 mg via INTRAVENOUS

## 2021-03-20 MED ORDER — LIDOCAINE 2% (20 MG/ML) 5 ML SYRINGE
INTRAMUSCULAR | Status: AC
  Filled 2021-03-20: qty 5

## 2021-03-20 MED ORDER — ENOXAPARIN SODIUM 40 MG/0.4ML ~~LOC~~ SOLN
SUBCUTANEOUS | Status: AC
  Filled 2021-03-20: qty 0.4

## 2021-03-20 MED ORDER — OXYCODONE-ACETAMINOPHEN 5-325 MG PO TABS
1.0000 | ORAL_TABLET | ORAL | Status: DC | PRN
  Administered 2021-03-20 (×2): 1 via ORAL

## 2021-03-20 MED ORDER — IBUPROFEN 800 MG PO TABS
800.0000 mg | ORAL_TABLET | Freq: Three times a day (TID) | ORAL | 0 refills | Status: DC | PRN
Start: 2021-03-20 — End: 2021-03-22

## 2021-03-20 MED ORDER — AMISULPRIDE (ANTIEMETIC) 5 MG/2ML IV SOLN: 10.0000 mg | Freq: Once | INTRAVENOUS | Status: DC | PRN

## 2021-03-20 MED ORDER — KETOROLAC TROMETHAMINE 30 MG/ML IJ SOLN
INTRAMUSCULAR | Status: AC
  Filled 2021-03-20: qty 1

## 2021-03-20 MED ORDER — KETOROLAC TROMETHAMINE 30 MG/ML IJ SOLN
30.0000 mg | Freq: Four times a day (QID) | INTRAMUSCULAR | Status: DC
  Administered 2021-03-20: 30 mg via INTRAVENOUS

## 2021-03-20 MED ORDER — INDIGOTINDISULFONATE SODIUM 8 MG/ML IJ SOLN
INTRAMUSCULAR | Status: DC | PRN
  Administered 2021-03-20: 40 mg via INTRAVENOUS

## 2021-03-20 MED ORDER — FENTANYL CITRATE (PF) 100 MCG/2ML IJ SOLN
INTRAMUSCULAR | Status: DC | PRN
  Administered 2021-03-20: 50 ug via INTRAVENOUS
  Administered 2021-03-20: 150 ug via INTRAVENOUS
  Administered 2021-03-20: 50 ug via INTRAVENOUS

## 2021-03-20 MED ORDER — GABAPENTIN 300 MG PO CAPS: 400.0000 mg | ORAL_CAPSULE | Freq: Every day | ORAL | Status: DC

## 2021-03-20 MED ORDER — MORPHINE SULFATE (PF) 4 MG/ML IV SOLN
1.0000 mg | INTRAVENOUS | Status: DC | PRN
Start: 2021-03-20 — End: 2021-03-21
  Administered 2021-03-20 (×2): 1 mg via INTRAVENOUS

## 2021-03-20 MED ORDER — POVIDONE-IODINE 10 % EX SWAB: 2.0000 "application " | Freq: Once | CUTANEOUS | Status: DC

## 2021-03-20 MED ORDER — ROCURONIUM BROMIDE 10 MG/ML (PF) SYRINGE
PREFILLED_SYRINGE | INTRAVENOUS | Status: DC | PRN
  Administered 2021-03-20: 50 mg via INTRAVENOUS
  Administered 2021-03-20: 20 mg via INTRAVENOUS
  Administered 2021-03-20: 10 mg via INTRAVENOUS

## 2021-03-20 MED ORDER — ONDANSETRON HCL 4 MG/2ML IJ SOLN
INTRAMUSCULAR | Status: AC
  Filled 2021-03-20: qty 2

## 2021-03-20 MED ORDER — MENTHOL 3 MG MT LOZG: 1.0000 | LOZENGE | OROMUCOSAL | Status: DC | PRN

## 2021-03-20 MED ORDER — DEXAMETHASONE SODIUM PHOSPHATE 10 MG/ML IJ SOLN
INTRAMUSCULAR | Status: AC
  Filled 2021-03-20: qty 1

## 2021-03-20 MED ORDER — FENTANYL CITRATE (PF) 100 MCG/2ML IJ SOLN
25.0000 ug | INTRAMUSCULAR | Status: DC | PRN
  Administered 2021-03-20 (×2): 25 ug via INTRAVENOUS
  Administered 2021-03-20: 50 ug via INTRAVENOUS

## 2021-03-20 MED ORDER — GABAPENTIN 300 MG PO CAPS
300.0000 mg | ORAL_CAPSULE | ORAL | Status: AC
  Administered 2021-03-20: 300 mg via ORAL

## 2021-03-20 MED ORDER — SCOPOLAMINE 1 MG/3DAYS TD PT72
MEDICATED_PATCH | TRANSDERMAL | Status: AC
  Filled 2021-03-20: qty 1

## 2021-03-20 MED ORDER — ACETAMINOPHEN 500 MG PO TABS
ORAL_TABLET | ORAL | Status: AC
  Filled 2021-03-20: qty 2

## 2021-03-20 MED ORDER — PHENYLEPHRINE 40 MCG/ML (10ML) SYRINGE FOR IV PUSH (FOR BLOOD PRESSURE SUPPORT)
PREFILLED_SYRINGE | INTRAVENOUS | Status: DC | PRN
  Administered 2021-03-20: 120 ug via INTRAVENOUS
  Administered 2021-03-20: 80 ug via INTRAVENOUS
  Administered 2021-03-20: 40 ug via INTRAVENOUS
  Administered 2021-03-20: 120 ug via INTRAVENOUS

## 2021-03-20 MED ORDER — KETAMINE HCL 50 MG/5ML IJ SOSY
PREFILLED_SYRINGE | INTRAMUSCULAR | Status: AC
  Filled 2021-03-20: qty 5

## 2021-03-20 MED ORDER — PROPOFOL 10 MG/ML IV BOLUS
INTRAVENOUS | Status: AC
  Filled 2021-03-20: qty 20

## 2021-03-20 MED ORDER — METOCLOPRAMIDE HCL 5 MG/ML IJ SOLN
10.0000 mg | Freq: Three times a day (TID) | INTRAMUSCULAR | Status: DC | PRN
  Administered 2021-03-20: 10 mg via INTRAVENOUS

## 2021-03-20 MED ORDER — IBUPROFEN 800 MG PO TABS: 800.0000 mg | ORAL_TABLET | Freq: Three times a day (TID) | ORAL | Status: DC | PRN

## 2021-03-20 MED ORDER — ENOXAPARIN SODIUM 40 MG/0.4ML ~~LOC~~ SOLN
40.0000 mg | SUBCUTANEOUS | Status: AC
  Administered 2021-03-20: 40 mg via SUBCUTANEOUS

## 2021-03-20 MED ORDER — LIDOCAINE 2% (20 MG/ML) 5 ML SYRINGE
INTRAMUSCULAR | Status: DC | PRN
  Administered 2021-03-20: 80 mg via INTRAVENOUS

## 2021-03-20 MED ORDER — PROPOFOL 10 MG/ML IV BOLUS
INTRAVENOUS | Status: DC | PRN
  Administered 2021-03-20: 150 mg via INTRAVENOUS

## 2021-03-20 MED ORDER — BUPIVACAINE HCL (PF) 0.25 % IJ SOLN
INTRAMUSCULAR | Status: DC | PRN
  Administered 2021-03-20: 10 mL

## 2021-03-20 MED ORDER — PHENYLEPHRINE 40 MCG/ML (10ML) SYRINGE FOR IV PUSH (FOR BLOOD PRESSURE SUPPORT)
PREFILLED_SYRINGE | INTRAVENOUS | Status: AC
  Filled 2021-03-20: qty 20

## 2021-03-20 MED ORDER — OXYCODONE-ACETAMINOPHEN 5-325 MG PO TABS: 1.0000 | ORAL_TABLET | ORAL | 0 refills | Status: DC | PRN

## 2021-03-20 MED ORDER — DEXAMETHASONE SODIUM PHOSPHATE 10 MG/ML IJ SOLN
INTRAMUSCULAR | Status: DC | PRN
  Administered 2021-03-20: 10 mg via INTRAVENOUS

## 2021-03-20 MED ORDER — SUGAMMADEX SODIUM 200 MG/2ML IV SOLN
INTRAVENOUS | Status: DC | PRN
  Administered 2021-03-20: 200 mg via INTRAVENOUS

## 2021-03-20 MED ORDER — MIDAZOLAM HCL 2 MG/2ML IJ SOLN
INTRAMUSCULAR | Status: AC
  Filled 2021-03-20: qty 2

## 2021-03-20 MED ORDER — ARTIFICIAL TEARS OPHTHALMIC OINT
TOPICAL_OINTMENT | OPHTHALMIC | Status: AC
  Filled 2021-03-20: qty 3.5

## 2021-03-20 MED ORDER — SODIUM CHLORIDE 0.9 % IR SOLN
Status: DC | PRN
  Administered 2021-03-20: 1000 mL
  Administered 2021-03-20: 3000 mL

## 2021-03-20 MED ORDER — ONDANSETRON HCL 4 MG PO TABS: 4.0000 mg | ORAL_TABLET | Freq: Four times a day (QID) | ORAL | Status: DC | PRN

## 2021-03-20 MED ORDER — MIDAZOLAM HCL 5 MG/5ML IJ SOLN
INTRAMUSCULAR | Status: DC | PRN
  Administered 2021-03-20: 2 mg via INTRAVENOUS

## 2021-03-20 MED ORDER — SODIUM CHLORIDE 0.9 % IV SOLN
INTRAVENOUS | Status: AC
  Filled 2021-03-20: qty 2

## 2021-03-20 MED ORDER — GABAPENTIN 300 MG PO CAPS
ORAL_CAPSULE | ORAL | Status: AC
  Filled 2021-03-20: qty 1

## 2021-03-20 MED ORDER — EPHEDRINE 5 MG/ML INJ
INTRAVENOUS | Status: AC
  Filled 2021-03-20: qty 10

## 2021-03-20 MED ORDER — ACETAMINOPHEN 500 MG PO TABS
1000.0000 mg | ORAL_TABLET | ORAL | Status: AC
  Administered 2021-03-20: 1000 mg via ORAL

## 2021-03-20 MED ORDER — ONDANSETRON HCL 4 MG/2ML IJ SOLN
INTRAMUSCULAR | Status: DC | PRN
  Administered 2021-03-20: 4 mg via INTRAVENOUS

## 2021-03-20 MED ORDER — PROPRANOLOL HCL 20 MG PO TABS
20.0000 mg | ORAL_TABLET | Freq: Three times a day (TID) | ORAL | Status: DC
  Filled 2021-03-20: qty 1

## 2021-03-20 MED ORDER — FENTANYL CITRATE (PF) 100 MCG/2ML IJ SOLN
INTRAMUSCULAR | Status: AC
  Filled 2021-03-20: qty 2

## 2021-03-20 MED ORDER — MORPHINE SULFATE (PF) 2 MG/ML IV SOLN
INTRAVENOUS | Status: AC
  Filled 2021-03-20: qty 1

## 2021-03-20 MED ORDER — METOCLOPRAMIDE HCL 5 MG/ML IJ SOLN
INTRAMUSCULAR | Status: AC
  Filled 2021-03-20: qty 2

## 2021-03-20 MED ORDER — KETOROLAC TROMETHAMINE 30 MG/ML IJ SOLN
INTRAMUSCULAR | Status: DC | PRN
  Administered 2021-03-20: 30 mg via INTRAVENOUS

## 2021-03-20 SURGICAL SUPPLY — 76 items
ADH SKN CLS APL DERMABOND .7 (GAUZE/BANDAGES/DRESSINGS) ×2
APL SRG 38 LTWT LNG FL B (MISCELLANEOUS)
APPLICATOR ARISTA FLEXITIP XL (MISCELLANEOUS) IMPLANT
BARRIER ADHS 3X4 INTERCEED (GAUZE/BANDAGES/DRESSINGS) IMPLANT
BLADE SURG 10 STRL SS (BLADE) ×12 IMPLANT
BLADE SURG 11 STRL SS (BLADE) ×3 IMPLANT
BRR ADH 4X3 ABS CNTRL BYND (GAUZE/BANDAGES/DRESSINGS)
CABLE HIGH FREQUENCY MONO STRZ (ELECTRODE) IMPLANT
CELL SAVER LIPIGURD (MISCELLANEOUS) ×2 IMPLANT
COVER BACK TABLE 60X90IN (DRAPES) IMPLANT
COVER LIGHT HANDLE STERIS (MISCELLANEOUS) ×6 IMPLANT
COVER MAYO STAND STRL (DRAPES) ×3 IMPLANT
COVER WAND RF STERILE (DRAPES) ×3 IMPLANT
DECANTER SPIKE VIAL GLASS SM (MISCELLANEOUS) ×6 IMPLANT
DERMABOND ADVANCED (GAUZE/BANDAGES/DRESSINGS) ×1
DERMABOND ADVANCED .7 DNX12 (GAUZE/BANDAGES/DRESSINGS) ×2 IMPLANT
DURAPREP 26ML APPLICATOR (WOUND CARE) ×3 IMPLANT
EXTRT SYSTEM ALEXIS 14CM (MISCELLANEOUS) ×3
EXTRT SYSTEM ALEXIS 17CM (MISCELLANEOUS)
GAUZE 4X4 16PLY RFD (DISPOSABLE) ×3 IMPLANT
GLOVE SURG ENC MOIS LTX SZ6.5 (GLOVE) ×9 IMPLANT
GLOVE SURG UNDER POLY LF SZ6.5 (GLOVE) ×18 IMPLANT
GLOVE SURG UNDER POLY LF SZ7 (GLOVE) ×3 IMPLANT
GLOVE SURG UNDER POLY LF SZ7.5 (GLOVE) ×12 IMPLANT
GOWN STRL REUS W/ TWL LRG LVL3 (GOWN DISPOSABLE) ×6 IMPLANT
GOWN STRL REUS W/TWL LRG LVL3 (GOWN DISPOSABLE) ×27 IMPLANT
HEMOSTAT ARISTA ABSORB 3G PWDR (HEMOSTASIS) IMPLANT
HOLDER FOLEY CATH W/STRAP (MISCELLANEOUS) ×3 IMPLANT
IV NS 1000ML (IV SOLUTION) ×3
IV NS 1000ML BAXH (IV SOLUTION) ×2 IMPLANT
IV NS IRRIG 3000ML ARTHROMATIC (IV SOLUTION) ×3 IMPLANT
KIT TURNOVER CYSTO (KITS) ×3 IMPLANT
LEGGING LITHOTOMY PAIR STRL (DRAPES) ×3 IMPLANT
LIGASURE VESSEL 5MM BLUNT TIP (ELECTROSURGICAL) ×3 IMPLANT
MANIFOLD NEPTUNE II (INSTRUMENTS) ×3 IMPLANT
NEEDLE INSUFFLATION 120MM (ENDOMECHANICALS) ×3 IMPLANT
NS IRRIG 500ML POUR BTL (IV SOLUTION) ×3 IMPLANT
OCCLUDER COLPOPNEUMO (BALLOONS) ×3 IMPLANT
PACK LAPAROSCOPY BASIN (CUSTOM PROCEDURE TRAY) ×3 IMPLANT
PACK TRENDGUARD 450 HYBRID PRO (MISCELLANEOUS) ×2 IMPLANT
POUCH LAPAROSCOPIC INSTRUMENT (MISCELLANEOUS) ×3 IMPLANT
PROTECTOR NERVE ULNAR (MISCELLANEOUS) ×6 IMPLANT
RETRACTOR WOUND ALXS 19CM XSML (INSTRUMENTS) IMPLANT
RTRCTR WOUND ALEXIS 19CM XSML (INSTRUMENTS)
SCISSORS LAP 5X35 DISP (ENDOMECHANICALS) IMPLANT
SET IRRIG Y TYPE TUR BLADDER L (SET/KITS/TRAYS/PACK) ×3 IMPLANT
SET SUCTION IRRIG HYDROSURG (IRRIGATION / IRRIGATOR) ×3 IMPLANT
SET TRI-LUMEN FLTR TB AIRSEAL (TUBING) ×3 IMPLANT
SHEARS HARMONIC ACE PLUS 36CM (ENDOMECHANICALS) ×3 IMPLANT
SLEEVE ADV FIXATION 5X100MM (TROCAR) ×3 IMPLANT
SUT DVC VLOC 180 0 12IN GS21 (SUTURE) ×3
SUT VIC AB 0 CT1 27 (SUTURE) ×6
SUT VIC AB 0 CT1 27XBRD ANBCTR (SUTURE) ×4 IMPLANT
SUT VIC AB 4-0 PS2 18 (SUTURE) ×3 IMPLANT
SUT VICRYL 0 UR6 27IN ABS (SUTURE) IMPLANT
SUT VLOC 180 0 9IN  GS21 (SUTURE)
SUT VLOC 180 0 9IN GS21 (SUTURE) IMPLANT
SUTURE DVC VLC 180 0 12IN GS21 (SUTURE) ×2 IMPLANT
SYR 10ML LL (SYRINGE) ×3 IMPLANT
SYR 50ML LL SCALE MARK (SYRINGE) ×6 IMPLANT
SYSTEM CARTER THOMASON II (TROCAR) IMPLANT
SYSTEM CONTND EXTRCTN KII BLLN (MISCELLANEOUS) IMPLANT
TIP RUMI ORANGE 6.7MMX12CM (TIP) IMPLANT
TIP UTERINE 5.1X6CM LAV DISP (MISCELLANEOUS) IMPLANT
TIP UTERINE 6.7X10CM GRN DISP (MISCELLANEOUS) ×3 IMPLANT
TIP UTERINE 6.7X6CM WHT DISP (MISCELLANEOUS) IMPLANT
TIP UTERINE 6.7X8CM BLUE DISP (MISCELLANEOUS) IMPLANT
TOWEL OR 17X26 10 PK STRL BLUE (TOWEL DISPOSABLE) ×6 IMPLANT
TRAY FOLEY W/BAG SLVR 14FR LF (SET/KITS/TRAYS/PACK) ×3 IMPLANT
TRENDGUARD 450 HYBRID PRO PACK (MISCELLANEOUS) ×3
TROCAR ADV FIXATION 5X100MM (TROCAR) ×3 IMPLANT
TROCAR BLADELESS OPT 5 100 (ENDOMECHANICALS) ×3 IMPLANT
TROCAR PORT AIRSEAL 5X120 (TROCAR) ×3 IMPLANT
TROCAR PORT AIRSEAL 8X120 (TROCAR) IMPLANT
TROCAR XCEL NON BLADE 8MM B8LT (ENDOMECHANICALS) IMPLANT
WARMER LAPAROSCOPE (MISCELLANEOUS) ×3 IMPLANT

## 2021-03-20 NOTE — Progress Notes (Signed)
Day of Surgery Procedure(s) (LRB): TOTAL LAPAROSCOPIC HYSTERECTOMY WITH SALPINGECTOMY WITH VAGINAL MORCELLATION OF UTERUS (N/A) CYSTOSCOPY (N/A)  Subjective: Patient reports tolerating PO and no problems voiding.  Nauseas resolved after receiving Reglan.  Ambulating.  Pain controlled with Percocet.  Received Toradol about 4:30 pm. Using a heating pad for pain relief.  Wants discharge to home.   Objective: I have reviewed patient's vital signs, intake and output and labs. Vitals:   03/20/21 1247 03/20/21 1616  BP: (!) 107/57 114/68  Pulse: 74 84  Resp: 12 17  Temp: 97.7 F (36.5 C) 98.2 F (36.8 C)  SpO2: 100% 98%   I/O - 3780 cc/280 cc  General: alert and cooperative Resp: clear to auscultation bilaterally Cardio: regular rate and rhythm, S1, S2 normal, no murmur, click, rub or gallop GI: soft, non-tender; bowel sounds normal; no masses,  no organomegaly and incision: clean, dry, intact and bilateral mid abdominal incisions with ecchymoses and mild tenderness.  No induration.  Extremities: Pas and Ted hose on.  DPs 2+ bilaterally.  Vaginal Bleeding: minimal   CBC    Component Value Date/Time   WBC 16.5 (H) 03/20/2021 1501   RBC 3.76 (L) 03/20/2021 1501   HGB 11.1 (L) 03/20/2021 1501   HCT 33.6 (L) 03/20/2021 1501   PLT 260 03/20/2021 1501   MCV 89.4 03/20/2021 1501   MCH 29.5 03/20/2021 1501   MCHC 33.0 03/20/2021 1501   RDW 12.0 03/20/2021 1501     Assessment: s/p Procedure(s): TOTAL LAPAROSCOPIC HYSTERECTOMY WITH SALPINGECTOMY WITH VAGINAL MORCELLATION OF UTERUS (N/A) CYSTOSCOPY (N/A): progressing well  Leukocytosis attributed to surgical dissection.  No clinical evidence of infection.   Plan: Discharge home  Rx for Percocet and Motrin.  Resume usual medications.  Post op instructions reviewed with patient in verbal and written form. Surgical findings and procedure reviewed.  Fu in one week.   LOS: 0 days    Arloa Koh 03/20/2021, 6:33  PM

## 2021-03-20 NOTE — H&P (Signed)
Office Visit  03/01/2021 [facility], MD  Obstetrics and Gynecology  Fibroids +1 more  Dx  Pelvic ultrasound ; Referred by Nunzio Cobbs, MD  Reason for Visit    Additional Documentation  Vitals:  BP 108/70 (Cuff Size: Large)  Pulse 75  Ht 5\' 4"  (1.626 m)  Wt 78 kg  LMP 02/18/2021 (Exact Date)  SpO2 98%  BMI 29.52 kg/m  BSA 1.88 m    More Vitals  Flowsheets:  Anthropometrics,  NEWS,  MEWS Score,  Method of Visit    Encounter Info:  Billing Info,  History,  Allergies,  Detailed Report     Orthostatic Vitals Recorded in This Encounter   03/01/2021  0922     Cuff Size: Large   All Notes    Progress Notes by Nunzio Cobbs, MD at 03/01/2021 9:30 AM  Author: Nunzio Cobbs, MD Author Type: Physician Filed: 03/01/2021 10:20 AM  Note Status: Signed Cosign: Cosign Not Required Encounter Date: 03/01/2021  Editor: Nunzio Cobbs, MD (Physician)      Prior Versions: 1. Lowella Fairy, CMA (Certified Psychologist, sport and exercise) at 03/01/2021  9:31 AM - Sign when Signing Visit    GYNECOLOGY  VISIT   HPI: 45 y.o.   Married  Caucasian  female   G3P3 with Patient's last menstrual period was 02/18/2021 (exact date).   here for pelvic ultrasound follow up of fibroid and menorrhagia with regular menses and dysmenorrhea.   CT scan 06/04/19 showing 5.3 cm fibroid. [facility] 11/29/19 benign proliferative endometirum with no atypia of hyperplasia. [name].   Uterus felt 16 week size on exam on 12/28/20 in office here.    Feels tired all the time. States she does has fibromyalgia.  No dx of anemia.  Declines future childbearing.  Wants hysterectomy.    GYNECOLOGIC HISTORY: Patient's last menstrual period was 02/18/2021 (exact date). Contraception:  Tubal Menopausal hormone therapy:  n/a Last mammogram:  10-12 years  ago had abnormal MMG in [place] with BX showing calcifications.  Mammogram in Jan or Feb, 2021, and this was normal. Last pap smear:   2020 normal and negative HR HPV - per patient. 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                  OB History     Gravida  3   Para  3   Term      Preterm      AB      Living  3      SAB      IAB      Ectopic      Multiple      Live Births                    There are no problems to display for this patient.         Past Medical History:  Diagnosis Date  . Dysmenorrhea    . Fibroid    . Fibromyalgia    . History of abnormal cervical Pap smear 2010    hx of colposcopy--no treatment  . Irregular heart rhythm      takes propranolol  . STD (sexually transmitted disease)      Hx of HPV           Past  Surgical History:  Procedure Laterality Date  . COLPOSCOPY      . TUBAL LIGATION                Current Outpatient Medications  Medication Sig Dispense Refill  . gabapentin (NEURONTIN) 400 MG capsule Take 400 mg by mouth daily.      . propranolol (INDERAL) 20 MG tablet Take 20 mg by mouth 3 (three) times daily.        No current facility-administered medications for this visit.      ALLERGIES: Patient has no known allergies.        Family History  Problem Relation Age of Onset  . Diabetes Maternal Grandfather    . Stroke Paternal Grandfather        Social History         Socioeconomic History  . Marital status: Married      Spouse name: Not on file  . Number of children: Not on file  . Years of education: Not on file  . Highest education level: Not on file  Occupational History  . Not on file  Tobacco Use  . Smoking status: Never Smoker  . Smokeless tobacco: Never Used  Vaping Use  . Vaping Use: Never used  Substance and Sexual Activity  . Alcohol use: Not Currently  . Drug use: Never  . Sexual activity: Yes      Birth control/protection: Surgical      Comment: Tubal`  Other Topics  Concern  . Not on file  Social History Narrative  . Not on file    Social Determinants of Health    Financial Resource Strain: Not on file  Food Insecurity: Not on file  Transportation Needs: Not on file  Physical Activity: Not on file  Stress: Not on file  Social Connections: Not on file  Intimate Partner Violence: Not on file      Review of Systems  All other systems reviewed and are negative.     PHYSICAL EXAMINATION:     BP 108/70 (Cuff Size: Large)   Pulse 75   Ht 5\' 4"  (1.626 m)   Wt 172 lb (78 kg)   LMP 02/18/2021 (Exact Date)   SpO2 98%   BMI 29.52 kg/m     General appearance: alert, cooperative and appears stated age   Pelvic US Uterus retroverted. Fibroids - 6.53 cm left upper, 1.72 cm, and 0.81 cm.  EMS 8.63 mm. Ovaries normal with normal follicle pattern.  No adnexal masses.  No free fluid.    ASSESSMENT   Fibroids.  Fatigue.     PLAN   US findings and images discussed.    CBC and TSH.    Fibroids treatment options reviewed - hormonal contraceptives, Freida Busman, Depo Lupron, hysteroscopic and laparoscopic ultrasound guided ablation, myomectomy, uterine artery embolization and hysterectomy reviewed.   I discussed total laparoscopic hysterectomy with bilateral salpingectomy and possible bilateral oophorectomy (if abnormal), vaginal morcellation of uterus in an Alexis bag, and cystoscopy.   I reviewed risks, benefits, and alternatives.  Risks include but are not limited to bleeding, infection, damage to surrounding organs, pneumonia, reaction to anesthesia, DVT, PE, death, need for reoperation, hernia formation, vaginal cuff dehiscence, neuropathy, need to convert to a traditional laparotomy incision to complete the procedure. Surgical expectations and recovery discussed.  She will receive Lovenox for DVT/PE prophylaxis. Patient wishes to proceed.   Questions invited and answered.   ACOG HO on hysterectomy.    She will update her mammogram.  35 min  total time was spent for this patient encounter, including preparation, face-to-face counseling with the patient, coordination of care, and documentation of the encounter.

## 2021-03-20 NOTE — Transfer of Care (Signed)
Immediate Anesthesia Transfer of Care Note  Patient: Scientist, product/process development  Procedure(s) Performed: TOTAL LAPAROSCOPIC HYSTERECTOMY WITH SALPINGECTOMY WITH VAGINAL MORCELLATION OF UTERUS (N/A Abdomen) CYSTOSCOPY (N/A Bladder)  Patient Location: PACU  Anesthesia Type:General  Level of Consciousness: drowsy, patient cooperative and responds to stimulation  Airway & Oxygen Therapy: Patient Spontanous Breathing and Patient connected to face mask oxygen  Post-op Assessment: Report given to RN and Post -op Vital signs reviewed and stable  Post vital signs: Reviewed and stable  Last Vitals:  Vitals Value Taken Time  BP 123/85 03/20/21 1047  Temp 36.8 C 03/20/21 1047  Pulse 62 03/20/21 1051  Resp 15 03/20/21 1051  SpO2 81 % 03/20/21 1051  Vitals shown include unvalidated device data.  Last Pain:  Vitals:   03/20/21 0611  TempSrc: Oral  PainSc: 4       Patients Stated Pain Goal: 6 (85/90/93 1121)  Complications: No complications documented.

## 2021-03-20 NOTE — Discharge Instructions (Signed)
Total Laparoscopic Hysterectomy, Care After The following information offers guidance on how to care for yourself after your procedure. Your health care provider may also give you more specific instructions. If you have problems or questions, contact your health care provider. What can I expect after the procedure? After the procedure, it is common to have:  Pain, bruising, and numbness around your incisions.  Tiredness (fatigue).  Poor appetite.  Less interest in sex.  Vaginal discharge or bleeding. You will need to use a sanitary pad after this procedure.  Feelings of sadness or other emotions. If your ovaries were also removed, it is also common to have symptoms of menopause, such as hot flashes, night sweats, and lack of sleep (insomnia). Follow these instructions at home: Medicines  Take over-the-counter and prescription medicines only as told by your health care provider.  Ask your health care provider if the medicine prescribed to you: ? Requires you to avoid driving or using machinery. ? Can cause constipation. You may need to take these actions to prevent or treat constipation:  Drink enough fluid to keep your urine pale yellow.  Take over-the-counter or prescription medicines.  Eat foods that are high in fiber, such as beans, whole grains, and fresh fruits and vegetables.  Limit foods that are high in fat and processed sugars, such as fried or sweet foods. Incision care  Follow instructions from your health care provider about how to take care of your incisions. Make sure you: ? Wash your hands with soap and water for at least 20 seconds before and after you change your bandage (dressing). If soap and water are not available, use hand sanitizer. ? Change your dressing as told by your health care provider. ? Leave stitches (sutures), skin glue, or adhesive strips in place. These skin closures may need to stay in place for 2 weeks or longer. If adhesive strip edges start  to loosen and curl up, you may trim the loose edges. Do not remove adhesive strips completely unless your health care provider tells you to do that.  Check your incision areas every day for signs of infection. Check for: ? More redness, swelling, or pain. ? Fluid or blood. ? Warmth. ? Pus or a bad smell.   Activity  Rest as told by your health care provider.  Avoid sitting for a long time without moving. Get up to take short walks every 1-2 hours. This is important to improve blood flow and breathing. Ask for help if you feel weak or unsteady.  Return to your normal activities as told by your health care provider. Ask your health care provider what activities are safe for you.  Do not lift anything that is heavier than 10 lb (4.5 kg), or the limit that you are told, for one month after surgery or until your health care provider says that it is safe.  If you were given a sedative during the procedure, it can affect you for several hours. Do not drive or operate machinery until your health care provider says that it is safe.   Lifestyle  Do not use any products that contain nicotine or tobacco. These products include cigarettes, chewing tobacco, and vaping devices, such as e-cigarettes. These can delay healing after surgery. If you need help quitting, ask your health care provider.  Do not drink alcohol until your health care provider approves. General instructions  Do not douche, use tampons, or have sex for at least 6 weeks, or as told by your health   care provider.  If you struggle with physical or emotional changes after your procedure, speak with your health care provider or a therapist.  Do not take baths, swim, or use a hot tub until your health care provider approves. You may only be allowed to take showers for 2-3 weeks.  Keep your dressing dry until your health care provider says it can be removed.  Try to have someone at home with you for the first 1-2 weeks to help with your  daily chores.  Wear compression stockings as told by your health care provider. These stockings help to prevent blood clots and reduce swelling in your legs.  Keep all follow-up visits. This is important.   Contact a health care provider if:  You have any of these signs of infection: ? Chills or a fever. ? More redness, swelling, or pain around an incision. ? Fluid or blood coming from an incision. ? Warmth coming from an incision. ? Pus or a bad smell coming from an incision.  An incision opens.  You feel dizzy or light-headed.  You have pain or bleeding when you urinate, or you are unable to urinate.  You have abnormal vaginal discharge.  You have pain that does not get better with medicine. Get help right away if:  You have a fever and your symptoms suddenly get worse.  You have severe abdominal pain.  You have chest pain or shortness of breath.  You faint.  You have pain, swelling, or redness in your leg.  You have heavy vaginal bleeding with blood clots, soaking through a sanitary pad in less than 1 hour. These symptoms may represent a serious problem that is an emergency. Do not wait to see if the symptoms will go away. Get medical help right away. Call your local emergency services (911 in the [place] Do not drive yourself to the hospital. Summary  After the procedure, it is common to have pain and bruising around your incisions.  Do not take baths, swim, or use a hot tub until your health care provider approves.  Do not lift anything that is heavier than 10 lb (4.5 kg), or the limit that you are told, for one month after surgery or until your health care provider says that it is safe.  Tell your health care provider if you have any signs or symptoms of infection after the procedure.  Get help right away if you have severe abdominal pain, chest pain, shortness of breath, or heavy bleeding from your vagina. This information is not intended to replace advice given  to you by your health care provider. Make sure you discuss any questions you have with your health care provider. Document Revised: 07/28/2020 Document Reviewed: 07/28/2020 Elsevier Patient Education  2021 [employer]  

## 2021-03-20 NOTE — Op Note (Signed)
OPERATIVE REPORT   PREOPERATIVE DIAGNOSIS:   uterine fibroids, dysmenorrhea.    POSTOPERATIVE DIAGNOSIS:   uterine fibroids, dysmenorrhea.      PROCEDURES:  Total laparoscopic hysterectomy with bilateral salpingectomy, Vaginal morcellation of uterus in [place] bag, cystoscopy   SURGEON:  Lenard Galloway, M.D.   ASSISTANT:   Dorothy Spark, M.D.   ANESTHESIA:  General endotracheal, intraperitoneal ropivicaine 30 mL diluted in 30 mL of normal saline, local with 0.25% Marcaine.   IVF:  1500 cc LR, 250 cc albumin   ESTIMATED BLOOD LOSS:  50  cc.   URINE OUTPUT:  30 cc.   COMPLICATIONS:  None.   INDICATIONS FOR THE PROCEDURE:      The patient is a 45 year old Gravida 38, 81 61 Caucasian female who presents with heavy menstrual flow, dysmenorrhea and known uterine fibroids.  The patient has had fibroids documented with CT scan and pelvic ultrasound.  She has a benign endometrial biopsy at an outside office.  Her pelvic ultrasound showed fibroids with the largest being 6.53 cm. Her ovaries were normal.   A plan is made to proceed with a total laparoscopic hysterectomy with bilateral salpingectomy, possible left oophorectomy, vaginal morcellation of uterine specimen in Alexis bag, and cystoscopy after risks, benefits, and alternatives are reviewed.   FINDINGS:      Exam under anesthesia revealed a 12 to 13 week size uterus.  No adnexal masses are noted.   Laparoscopy revealed an enlarged broad uterus with fundal fibroid formation.  She had normal bilateral ovaries. Her fallopian tubes were consistent with prior bilateral tubal ligation with more separation of the proximal and distal tube on the right tube than on the left tube.  The upper abdomen was normal and demonstrated a normal liver.  The gallbladder was somewhat distended. The appendix was normal.  There was no endometriosis seen in the abdomen or pelvis.  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   Cystoscopy at the termination of the procedure showed the bladder to be normal throughout 360 degrees including the bladder dome and trigone. There was no evidence of any foreign body in the bladder or the urethra. There was no evidence of any lesions  of the bladder or the urethra.   Both of the ureters were noted to be patent bilaterally.   SPECIMENS:      The uterus, cervix, and bilateral tubes were went to pathology.  The uterine weight was 503 grams.    DESCRIPTION OF PROCEDURE:     The patient was reidentified in the preoperative hold area.   She did receive Cefotetan IV  for antibiotic prophylaxis.  She received Lovenox, TED hose, and PAS stockings for DVT prophylaxis.   In the operating room, the patient was placed in the dorsal lithotomy position on the operating room table.   Her legs were placed in the [place] stirrups and her arms were both tucked at her sides. The Trendguard was used to support her shoulders.  The patient received general endotracheal anesthesia. The abdomen and vagina were then sterilely prepped and she was sterilely draped.   A speculum was placed in the vagina and a single-tooth tenaculum was placed on the anterior cervical lip.  A figure-of-eight suture of 0 Vicryl was placed on each the anterior and the posterior cervical lips. The uterus was sounded to 11 cm.  The cervix was then dilated with [facility] dilators.  A number 10 RUMI tip  with a KOH ring was then placed through the cervix and into the uterine cavity without difficulty.  The remaining vaginal instruments were then removed.  A Foley catheter was placed inside the bladder.   Attention was turned to the abdomen where the umbilical region was injected with 0.25% Marcaine and a small incision created.  A Veress needle was then used to insufflate the abdomen with CO2 gas after a saline drop test was performed and the fluid flowed freely.  A 5 mm umbilical incision was created with a scalpel  after the skin.  A 5 mm camera port was then placed using the Optiview.  8 mm incisions were then created in the left mid abdomen and the right mid abdomen after the skin was injected locally with 0.25% Marcaine.  The 5 mm Airseal  trocar was then placed on the right and an 8 mm trocar was placed on the left, each under visualization of the laparoscope.  A 5 mm trocar was placed in the left lower quadrant after injecting with Marcaine and incising with a scalpel.    Ropivicine 30 cc diluted in 30 cc of normal saline was placed inside the peritoneal cavity.    The patient was placed in Trendelenburg position.  An inspection of the abdomen and pelvis was performed. The findings are as noted above.    The bilateral ureters were identified.  The left fallopian tube was grasped and the Ligasure was used to cauterize and cut through the mesosalpinx to the level of the uterus.  The specimen did not fit out of the 8 mm trocar, so it placed in the cul de sac and removed at the termination of the procedure at the time of the colpotomy.  The left utero-ovarian ligament was similarly cauterized and cut with the Ligasure.  The left round ligament was then cauterized and divided with same instrument.  Access to the left side of the uterus was not possible at this time due to the size of the uterine fundus.    Attention was turned to the patient's right-hand side and a 30 degree laparoscope was used.  The right fallopian tube was grasped and the Ligasure was used to cauterize and cut through the mesosalpinx to the level of the uterus.  The specimen was placed removed from the peritoneal cavity.  The right utero-ovarian ligament was cauterized and cut with the Ligasure.  The right round ligament was then cauterized and divided with same instrument.  Dissection was performed to the anterior and posterior leaves of the broad ligaments using the Harmonic scalpel.  The incision was carried across the anterior cul- de-sac along  the vesicouterine fold and the bladder was dissected away from the cervix. The peritoneum was taken down posteriorly.  The right uterine artery was skeletonized using the Harmonic scalpel.  It was then cauterized and cut with the Ligasure instrument.  The same procedure that was performed on the right side was repeated on the left side with respect to the isolation, cautery, and transection of the vessels and the bladder flap dissection.       The KOH ring was nicely visible.  The colpotomy incision was performed with the Harmonic scalpel in a circumferential fashion. An Alexis bag was placed in the peritoneal cavity through the colpotomy incision.  The uterus was placed inside the bag and then drawn out into the vagina.  A weight speculum was placed in the vagina inside the bag.  The uterus was morcellated with a  scalpel inside the bag. There was a small 7 mm perforation of the Alexis bag in the vagina at the level of the introitus which occurred at the end of the morcellation procedure.  This was closed with a hemostat.  The specimen was sent to Pathology.  The balloon occluder was placed in the vagina.  The vaginal cuff was sutured using a running suture of 0 V-Loc.  The vagina was closed from the patient's right hand side to the left hand side and then back 2 sutures towards the midline.  This provided good full-thickness closure of the vaginal cuff.  The laparoscopic needle for suturing was removed from the peritoneal cavity.   The pelvis was irrigated and suctioned.  The pneumoperitoneal was let down.  There was good hemostasis of the operative sites and pedicles.      The CO2 pneumoperitoneum was released.  The patient received manual breaths to remove any remaining CO2 gas and all trocars were removed.  The right and left trocar sites were closed with subcuticular sutures of 4/0 Vicryl.  Dermabond was placed over all of the incisions.  The vaginal occluder balloon was removed from the vagina.     The patient's Foley catheter was removed and cystoscopy was performed and the findings are as noted above.  The Foley catheter was left out.    Final inspection of the vagina demonstrated good hemostasis of the vaginal cuff.   This concluded the patient's procedure.  She was extubated and escorted to the recovery room in stable and awake condition.  There were no complications to the procedure.  All needle, instrument, and sponge counts were correct.  An MD assistant was necessary for tissue manipulation, management of instrumentation, retraction, and morcellation of the uterine specimen due to the size and weight of the uterus.    Lenard Galloway, M.D.

## 2021-03-20 NOTE — Progress Notes (Signed)
Update to History and Physical  No marked change in status since office preop visit.  Took her propranolol this am.  Patient examined.  Lungs CTA bilaterally.  Cor S1S2 RRR.  OK to proceed with surgery.

## 2021-03-20 NOTE — Anesthesia Postprocedure Evaluation (Signed)
Anesthesia Post Note  Patient: Scientist, product/process development  Procedure(s) Performed: TOTAL LAPAROSCOPIC HYSTERECTOMY WITH SALPINGECTOMY WITH VAGINAL MORCELLATION OF UTERUS (N/A Abdomen) CYSTOSCOPY (N/A Bladder)     Patient location during evaluation: PACU Anesthesia Type: General Level of consciousness: awake and alert Pain management: pain level controlled Vital Signs Assessment: post-procedure vital signs reviewed and stable Respiratory status: spontaneous breathing, nonlabored ventilation, respiratory function stable and patient connected to nasal cannula oxygen Cardiovascular status: blood pressure returned to baseline and stable Postop Assessment: no apparent nausea or vomiting Anesthetic complications: no   No complications documented.  Last Vitals:  Vitals:   03/20/21 1213 03/20/21 1247  BP: (!) 101/54 (!) 107/57  Pulse: 76 74  Resp: 14 12  Temp: 36.7 C 36.5 C  SpO2: 100% 100%    Last Pain:  Vitals:   03/20/21 1410  TempSrc:   PainSc: 5                  Tiajuana Amass

## 2021-03-20 NOTE — Anesthesia Procedure Notes (Signed)
Procedure Name: Intubation Date/Time: 03/20/2021 7:40 AM Performed by: Rogers Blocker, CRNA Pre-anesthesia Checklist: Patient identified, Emergency Drugs available, Suction available and Patient being monitored Patient Re-evaluated:Patient Re-evaluated prior to induction Oxygen Delivery Method: Circle System Utilized Preoxygenation: Pre-oxygenation with 100% oxygen Induction Type: IV induction Ventilation: Mask ventilation without difficulty Laryngoscope Size: Mac and 3 Grade View: Grade I Tube type: Oral Number of attempts: 1 Airway Equipment and Method: Stylet Placement Confirmation: ETT inserted through vocal cords under direct vision,  positive ETCO2 and breath sounds checked- equal and bilateral Secured at: 22 cm Tube secured with: Tape Dental Injury: Teeth and Oropharynx as per pre-operative assessment

## 2021-03-21 ENCOUNTER — Encounter (HOSPITAL_BASED_OUTPATIENT_CLINIC_OR_DEPARTMENT_OTHER): Payer: Self-pay | Admitting: Obstetrics and Gynecology

## 2021-03-21 LAB — SURGICAL PATHOLOGY

## 2021-03-22 ENCOUNTER — Encounter: Payer: Self-pay | Admitting: Obstetrics and Gynecology

## 2021-03-22 ENCOUNTER — Other Ambulatory Visit: Payer: Self-pay | Admitting: Obstetrics and Gynecology

## 2021-03-22 MED ORDER — IBUPROFEN 800 MG PO TABS: 800.0000 mg | ORAL_TABLET | Freq: Three times a day (TID) | ORAL | 0 refills | Status: DC | PRN

## 2021-03-24 ENCOUNTER — Emergency Department (HOSPITAL_COMMUNITY)
Admission: EM | Admit: 2021-03-24 | Discharge: 2021-03-24 | Disposition: A | Discharge status: Home / Self Care | Payer: BC Managed Care – PPO | Attending: Emergency Medicine | Admitting: Emergency Medicine

## 2021-03-24 ENCOUNTER — Encounter (HOSPITAL_COMMUNITY): Payer: Self-pay

## 2021-03-24 ENCOUNTER — Telehealth: Payer: Self-pay | Admitting: Obstetrics and Gynecology

## 2021-03-24 ENCOUNTER — Emergency Department (HOSPITAL_COMMUNITY): Payer: BC Managed Care – PPO

## 2021-03-24 ENCOUNTER — Other Ambulatory Visit: Payer: Self-pay

## 2021-03-24 DIAGNOSIS — R110 Nausea: Secondary | ICD-10-CM | POA: Insufficient documentation

## 2021-03-24 DIAGNOSIS — R197 Diarrhea, unspecified: Secondary | ICD-10-CM | POA: Diagnosis not present

## 2021-03-24 DIAGNOSIS — R1032 Left lower quadrant pain: Secondary | ICD-10-CM | POA: Insufficient documentation

## 2021-03-24 DIAGNOSIS — R109 Unspecified abdominal pain: Secondary | ICD-10-CM | POA: Diagnosis not present

## 2021-03-24 DIAGNOSIS — R1084 Generalized abdominal pain: Secondary | ICD-10-CM

## 2021-03-24 LAB — LIPASE, BLOOD: Lipase: 32 U/L (ref 11–51)

## 2021-03-24 LAB — CBC WITH DIFFERENTIAL/PLATELET
Abs Immature Granulocytes: 0.04 10*3/uL (ref 0.00–0.07)
Basophils Absolute: 0 10*3/uL (ref 0.0–0.1)
Basophils Relative: 0 %
Eosinophils Absolute: 0.2 10*3/uL (ref 0.0–0.5)
Eosinophils Relative: 2 %
HCT: 35.8 % — ABNORMAL LOW (ref 36.0–46.0)
Hemoglobin: 12.3 g/dL (ref 12.0–15.0)
Immature Granulocytes: 0 %
Lymphocytes Relative: 18 %
Lymphs Abs: 1.6 10*3/uL (ref 0.7–4.0)
MCH: 30 pg (ref 26.0–34.0)
MCHC: 34.4 g/dL (ref 30.0–36.0)
MCV: 87.3 fL (ref 80.0–100.0)
Monocytes Absolute: 0.6 10*3/uL (ref 0.1–1.0)
Monocytes Relative: 7 %
Neutro Abs: 6.5 10*3/uL (ref 1.7–7.7)
Neutrophils Relative %: 73 %
Platelets: 381 10*3/uL (ref 150–400)
RBC: 4.1 MIL/uL (ref 3.87–5.11)
RDW: 12 % (ref 11.5–15.5)
WBC: 8.9 10*3/uL (ref 4.0–10.5)
nRBC: 0 % (ref 0.0–0.2)

## 2021-03-24 LAB — COMPREHENSIVE METABOLIC PANEL
ALT: 27 U/L (ref 0–44)
AST: 23 U/L (ref 15–41)
Albumin: 4.1 g/dL (ref 3.5–5.0)
Alkaline Phosphatase: 63 U/L (ref 38–126)
Anion gap: 9 (ref 5–15)
BUN: 5 mg/dL — ABNORMAL LOW (ref 6–20)
CO2: 23 mmol/L (ref 22–32)
Calcium: 8.8 mg/dL — ABNORMAL LOW (ref 8.9–10.3)
Chloride: 107 mmol/L (ref 98–111)
Creatinine, Ser: 0.63 mg/dL (ref 0.44–1.00)
GFR, Estimated: >60 mL/min (ref >60)
Glucose, Bld: 105 mg/dL — ABNORMAL HIGH (ref 70–99)
Potassium: 3.4 mmol/L — ABNORMAL LOW (ref 3.5–5.1)
Sodium: 139 mmol/L (ref 135–145)
Total Bilirubin: 0.6 mg/dL (ref 0.3–1.2)
Total Protein: 7.1 g/dL (ref 6.5–8.1)

## 2021-03-24 LAB — URINALYSIS, ROUTINE W REFLEX MICROSCOPIC
Bilirubin Urine: NEGATIVE
Glucose, UA: NEGATIVE mg/dL
Hgb urine dipstick: NEGATIVE
Ketones, ur: 20 mg/dL — AB
Leukocytes,Ua: NEGATIVE
Nitrite: NEGATIVE
Protein, ur: NEGATIVE mg/dL
Specific Gravity, Urine: 1.015 (ref 1.005–1.030)
pH: 6 (ref 5.0–8.0)

## 2021-03-24 LAB — POC OCCULT BLOOD, ED: Fecal Occult Bld: NEGATIVE

## 2021-03-24 MED ORDER — SODIUM CHLORIDE 0.9 % IV BOLUS
1000.0000 mL | Freq: Once | INTRAVENOUS | Status: AC
  Administered 2021-03-24: 1000 mL via INTRAVENOUS

## 2021-03-24 MED ORDER — PROMETHAZINE HCL 25 MG/ML IJ SOLN
25.0000 mg | Freq: Once | INTRAMUSCULAR | Status: AC
  Administered 2021-03-24: 25 mg via INTRAMUSCULAR
  Filled 2021-03-24: qty 1

## 2021-03-24 MED ORDER — MORPHINE SULFATE (PF) 4 MG/ML IV SOLN
4.0000 mg | Freq: Once | INTRAVENOUS | Status: AC
Start: 2021-03-24 — End: 2021-03-24
  Administered 2021-03-24: 4 mg via INTRAVENOUS
  Filled 2021-03-24: qty 1

## 2021-03-24 MED ORDER — ONDANSETRON HCL 4 MG/2ML IJ SOLN
4.0000 mg | Freq: Once | INTRAMUSCULAR | Status: AC
  Administered 2021-03-24: 4 mg via INTRAVENOUS
  Filled 2021-03-24: qty 2

## 2021-03-24 MED ORDER — IOHEXOL 300 MG/ML  SOLN
100.0000 mL | Freq: Once | INTRAMUSCULAR | Status: AC | PRN
  Administered 2021-03-24: 100 mL via INTRAVENOUS

## 2021-03-24 MED ORDER — IOHEXOL 9 MG/ML PO SOLN
ORAL | Status: AC
  Filled 2021-03-24: qty 1000

## 2021-03-24 MED ORDER — HYDROMORPHONE HCL 1 MG/ML IJ SOLN
1.0000 mg | Freq: Once | INTRAMUSCULAR | Status: AC
  Administered 2021-03-24: 1 mg via INTRAVENOUS
  Filled 2021-03-24: qty 1

## 2021-03-24 NOTE — ED Notes (Signed)
Patient transported to CT 

## 2021-03-24 NOTE — ED Triage Notes (Signed)
ED provider stated patient needs to have next available room

## 2021-03-24 NOTE — ED Provider Notes (Signed)
[place] DEPT Provider Note   CSN: [id number] Arrival date & time: 03/24/21  6270     History Chief Complaint  Patient presents with  . Abdominal Pain  . Nausea  . Diarrhea    Brooke [name] is a 45 y.o. female with a history of laparoscopic hysterectomy performed on 03/20/2021.  Patient reports that her surgery was performed by Dr. Quincy Simmonds in the outpatient setting.  Patient presents with chief complaint of abdominal pain, diarrhea, and nausea.  Patient reports that her symptoms began yesterday.  Patient reports lower quadrant abdominal pain.  Patient rates pain 5/10 on pain scale.  Patient denies any radiation of her pain.  Patient reports pain is worse with bending over, eating or drinking.  Patient reports pain is improved when she is laying still.  Patient reports diarrhea began yesterday.  Patient reports approximately 30 or more episodes of diarrhea in the last 24 hours.  Patient reports diarrhea is dark in color.  Patient denies any melena or bright red blood in stool.  Patient endorses nausea but denies any vomiting.  Patient was that she did have 1 syncopal episode last night while laying in bed.  This episode occurred after she returned to bed from having a bowel movement.  Patient also endorses shortness of breath stating she "feels out of breath," chills, and fatigue.  Patient denies any fevers, dysuria, heme area, urinary frequency, vaginal pain, vaginal discharge, vaginal bleeding, erythema incision sites, drainage from incision sites.  HPI     Past Medical History:  Diagnosis Date  . Chronic back pain   . Dysmenorrhea   . Fibroid   . Fibromyalgia   . Headache   . History of abnormal cervical Pap smear 2010   no tx  . Irregular heart rhythm    takes propranolol managed by pcp no current cardiologist  . 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 Status post  laparoscopic hysterectomy 03/20/2021  . Status post laparoscopic hysterectomy 03/20/2021    Past Surgical History:  Procedure Laterality Date  . colonscopy  2020  . CYSTOSCOPY N/A 03/20/2021   Procedure: CYSTOSCOPY;  Surgeon: Nunzio Cobbs, MD;  Location: [facility];  Service: Gynecology;  Laterality: N/A;  . TOTAL LAPAROSCOPIC HYSTERECTOMY WITH SALPINGECTOMY N/A 03/20/2021   Procedure: TOTAL LAPAROSCOPIC HYSTERECTOMY WITH SALPINGECTOMY WITH VAGINAL MORCELLATION OF UTERUS;  Surgeon: Nunzio Cobbs, MD;  Location: [facility];  Service: Gynecology;  Laterality: N/A;  . TUBAL LIGATION  19 yrs ago     OB History    Gravida  3   Para  3   Term      Preterm      AB      Living  3     SAB      IAB      Ectopic      Multiple      Live Births              Family History  Problem Relation Age of Onset  . Diabetes Maternal Grandfather   . Stroke Paternal Grandfather     Social History   Tobacco Use  . Smoking status: Never Smoker  . Smokeless tobacco: Never Used  Vaping Use  . Vaping Use: Never used  Substance Use Topics  . Alcohol use: Not Currently  . Drug use: Yes  Types: Marijuana    Comment: marijuana edibles in 2020 beginning of 2021 in [place] for fibromyalgia, last one 03/15/21     Home Medications Prior to Admission medications   Medication Sig Start Date End Date Taking? Authorizing Provider  gabapentin (NEURONTIN) 400 MG capsule Take 400 mg by mouth at bedtime.    [provider]  ibuprofen (ADVIL) 800 MG tablet Take 1 tablet (800 mg total) by mouth every 8 (eight) hours as needed for moderate pain or mild pain. 03/22/21   Nunzio Cobbs, MD  oxyCODONE-acetaminophen (PERCOCET/ROXICET) 5-325 MG tablet Take 1-2 tablets by mouth every 4 (four) hours as needed for moderate pain. 03/20/21   Nunzio Cobbs, MD  propranolol (INDERAL) 20 MG tablet Take 20 mg by mouth 3  (three) times daily. 09/17/20   [provider]    Allergies    Patient has no known allergies.  Review of Systems   Review of Systems  Constitutional: Positive for chills and fatigue. Negative for fever.  Eyes: Negative for visual disturbance.  Respiratory: Positive for shortness of breath.   Cardiovascular: Negative for chest pain and leg swelling.  Gastrointestinal: Positive for abdominal pain, diarrhea and nausea. Negative for abdominal distention, anal bleeding, blood in stool, constipation, rectal pain and vomiting.  Genitourinary: Negative for difficulty urinating, dysuria, flank pain, frequency, genital sores, hematuria, pelvic pain, vaginal bleeding, vaginal discharge and vaginal pain.  Musculoskeletal: Negative for back pain and neck pain.  Skin: Negative for color change and rash.  Neurological: Positive for syncope. Negative for dizziness, tremors, seizures, facial asymmetry, speech difficulty, weakness, light-headedness, numbness and headaches.  Psychiatric/Behavioral: Negative for confusion.    Physical Exam Updated Vital Signs BP 137/78 (BP Location: Right Arm)   Pulse 70   Temp 99.2 F (37.3 C) (Oral)   Resp 16   Ht 5\' 4"  (1.626 m)   Wt 74.5 kg   SpO2 100%   BMI 28.20 kg/m   Physical Exam Vitals and nursing note reviewed. Exam conducted with a chaperone present (Female RN present).  Constitutional:      General: She is not in acute distress.    Appearance: She is ill-appearing. She is not toxic-appearing or diaphoretic.  HENT:     Head: Normocephalic.  Eyes:     General: No scleral icterus.       Right eye: No discharge.        Left eye: No discharge.  Cardiovascular:     Rate and Rhythm: Normal rate.     Heart sounds: Normal heart sounds.  Pulmonary:     Effort: Pulmonary effort is normal. No tachypnea, bradypnea or respiratory distress.     Breath sounds: Normal breath sounds. No stridor.  Abdominal:     General: Abdomen is flat. A  surgical scar is present. There is no distension. There are no signs of injury.     Palpations: Abdomen is soft. There is no mass or pulsatile mass.     Tenderness: There is abdominal tenderness in the right lower quadrant and left lower quadrant. There is left CVA tenderness. There is no right CVA tenderness, guarding or rebound.     Hernia: There is no hernia in the umbilical area or ventral area.       Comments: Laparoscopic incision sites noted as above, incisions are dry, intact.  Bruising noted around incision sites, no surrounding erythema or discharge noted.  Tenderness to left lower quadrant > right lower quadrant  Genitourinary:  Rectum: Normal. No mass, tenderness, anal fissure, external hemorrhoid or internal hemorrhoid. Normal anal tone.     Comments: No melena or blood noted on gloved hand after rectal exam Musculoskeletal:     Cervical back: Neck supple.     Right lower leg: Normal.     Left lower leg: Normal.  Skin:    General: Skin is warm and dry.  Neurological:     General: No focal deficit present.     Mental Status: She is alert.  Psychiatric:        Behavior: Behavior is cooperative.     ED Results / Procedures / Treatments   Labs (all labs ordered are listed, but only abnormal results are displayed) Labs Reviewed  CBC WITH DIFFERENTIAL/PLATELET - Abnormal; Notable for the following components:      Result Value   HCT 35.8 (*)    All other components within normal limits  COMPREHENSIVE METABOLIC PANEL - Abnormal; Notable for the following components:   Potassium 3.4 (*)    Glucose, Bld 105 (*)    BUN 5 (*)    Calcium 8.8 (*)    All other components within normal limits  URINALYSIS, ROUTINE W REFLEX MICROSCOPIC - Abnormal; Notable for the following components:   Ketones, ur 20 (*)    All other components within normal limits  LIPASE, BLOOD  POC OCCULT BLOOD, ED    EKG None  Radiology No results found.  Procedures Procedures   Medications  Ordered in ED Medications  morphine 4 MG/ML injection 4 mg (has no administration in time range)  ondansetron (ZOFRAN) injection 4 mg (has no administration in time range)  sodium chloride 0.9 % bolus 1,000 mL (has no administration in time range)    ED Course  I have reviewed the triage vital signs and the nursing notes.  Pertinent labs & imaging results that were available during my care of the patient were reviewed by me and considered in my medical decision making (see chart for details).    MDM Rules/Calculators/A&P                          Ill appearing 45 year old female in no acute distress, nontoxic-appearing.  Patient presents with chief complaint of abdominal pain, diarrhea, and nausea.  Patient reports that her symptoms began yesterday.  Per chart review patient had total laparoscopic hysterectomy with salpingectomy with vaginal morcellation of uterus performed by Dr.Silva 03/20/2021.    On physical exam abdomen soft, nondistended and is to lower abdominal quadrants, tenderness worse in left lower quadrant, no guarding or rebound tenderness.  Patient has multiple surgical incisions which are all clean, dry, and intact.  On rectal exam no blood or melena noted, Hemoccult negative.  Patient noted to have rectal temperature of 100.0 Fahrenheit  CMP, CBC, lipase, urinalysis obtained.     Lipase within normal limits. Urinalysis shows no signs of infection. CBC shows no signs of infection or anemia. CMP shows potassium slightly decreased at 3.4.   Will obtain CT abdomen due to patient's recent surgery and pain.  Patient given morphine, Zofran, and 1 L fluid bolus.  On serial examination patient's abdomen remains soft, nondistended, and tender to lower quadrants.  Patient given an Phenergan due to continued complaints of nausea.  Patient care transferred to [facility] at the end of my shift. Patient presentation, ED course, and plan of care discussed with review of all pertinent  labs and imaging. Please see  his/her note for further details regarding further ED course and disposition.  Final Clinical Impression(s) / ED Diagnoses Final diagnoses:  None    Rx / DC Orders ED Discharge Orders    None       Loni Beckwith, PA-C 03/25/21 0043    Hayden Rasmussen, MD 03/25/21 1002

## 2021-03-24 NOTE — ED Triage Notes (Signed)
Patient reports she had a laprascopic hysterectomy on 03/20/21.. she developed dark colored diarrhea yesterday along with abd pain and tenderness, lethargy and weakness. Temp of 99.2 noted in triage. Pain rated 5/10

## 2021-03-24 NOTE — Discharge Summary (Signed)
Physician Discharge Summary  Patient ID: Brooke [name] MRN: [id number] DOB/AGE: 44-Oct-1977 44 y.o.  Admit date: 03/20/2021 Discharge date: 03/20/21 Admission Diagnoses: 1.  Fibroid uterus 2.  Dysmenorrhea  Discharge Diagnoses:  1.  Fibroid uterus 2.  Dysmenorrhea 3.  Status post total laparoscopic hysterectomy, bilateral salpingectomy, vaginal morcellation of uterus in Alexis bag, cystoscopy  Active Problems:   Status post laparoscopic hysterectomy   Status post laparoscopic hysterectomy   Discharged Condition: good  Hospital Course: The patient was admitted on 03/20/21 for a total laparoscopic hysterectomy, bilateral salpingectomy, vaginal morcellation of uterus in Alexis bag, and cystoscopy which were performed without complication while under general anesthesia.  The patient's post op course was uneventful.  She received IV Toradol and Percocet for pain control.  She had post op nausea, which resolved with IV Reglan.  She then tolerated a regular diet.  She ambulated independently and wore PAS and Ted hose for DVT prophylaxis while in bed. She received preop Lovenox for DT prophylaxis as well.  Her foley catheter were removed in the OR at the termination of surgery, and she voided spontaneously following this. The patient's vital signs remained stable and she demonstrated no signs of clinical infection during her hospitalization.  The patient's post op zero one Hgb was 11.1, and her white blood cell count was 16.5, which was attributed to surgical dissection.  She had very minimal vaginal bleeding, and her incisions demonstrated no signs of erythema or significant drainage.  She was found to be in good condition and ready for discharge on post op day zero.  Consults: None  Significant Diagnostic Studies: labs:  See Hospital Course  Treatments: surgery:  Total laparoscopic hysterectomy, bilateral salpingectomy, vaginal morcellation of uterus in Alexis bag, cystoscopy   Discharge  Exam: Blood pressure 118/68, pulse 84, temperature 98.2 F (36.8 C), resp. rate 17, height 5\' 4"  (1.626 m), weight 74.5 kg, last menstrual period 02/18/2021, SpO2 96 %.   I/O - 3780 cc/280 cc  General: alert and cooperative Resp: clear to auscultation bilaterally Cardio: regular rate and rhythm, S1, S2 normal, no murmur, click, rub or gallop GI: soft, non-tender; bowel sounds normal; no masses,  no organomegaly and incision: clean, dry, intact and bilateral mid abdominal incisions with ecchymoses and mild tenderness.  No induration.  Extremities: Pas and Ted hose on.  DPs 2+ bilaterally.  Vaginal Bleeding: minimal   Disposition: Discharge disposition: 01-Home or Self Care     Discharge instructions were reviewed in verbal and written form.   Discharge Instructions    Discharge patient   Complete by: As directed    Discharge disposition: 01-Home or Self Care   Discharge patient date: 03/20/2021     Allergies as of 03/20/2021   No Known Allergies     Medication List    TAKE these medications   gabapentin 400 MG capsule Commonly known as: NEURONTIN Take 400 mg by mouth at bedtime.   oxyCODONE-acetaminophen 5-325 MG tablet Commonly known as: PERCOCET/ROXICET Take 1-2 tablets by mouth every 4 (four) hours as needed for moderate pain. Notes to patient: Take next dose at 9:30 pm tonight   propranolol 20 MG tablet Commonly known as: INDERAL Take 20 mg by mouth 3 (three) times daily.       Follow-up Information    Nunzio Cobbs, MD In 1 week.   Specialty: Obstetrics and Gynecology Contact information: [address] [place] [address] [phone]  Signed: Arloa Koh 03/24/2021, 4:44 PM

## 2021-03-24 NOTE — ED Triage Notes (Signed)
Emergency Medicine Provider Triage Evaluation Note  Brooke [name] , a 45 y.o. female  was evaluated in triage.  Pt complains of abdominal pain, nausea, and diarrhea.  Patient reports that her symptoms.  Patient reports that her diarrhea is "dark in color."  Denies seeing any bright red blood in stool.  Patient denies any episodes of vomiting.  Abdominal pain primarily lower quadrants.  Patient also endorses fatigue and chills  She had a laparoscopic hysterectomy performed on 03/20/2021.    Review of Systems  Positive:  Abdominal pain, nausea, diarrhea, chills Negative: Fever, vomiting, dysuria, hematuria, vaginal bleeding, vaginal discharge  Physical Exam  BP (!) 169/96 (BP Location: Right Arm)   Pulse 70   Temp 99.2 F (37.3 C) (Oral)   Resp 20   Ht 5\' 4"  (1.626 m)   Wt 74.5 kg   SpO2 100%   BMI 28.20 kg/m  Gen:   Awake, no distress   HEENT:  Atraumatic  Resp:  Normal effort  Cardiac:  Normal rate  Abd:   Nondistended, soft, tenderness to left lower quadrant MSK:   Moves extremities without difficulty  Neuro:  Speech clear   Medical Decision Making  Medically screening exam initiated at 6:20 PM.  Appropriate orders placed.  Jacoba Camilli was informed that the remainder of the evaluation will be completed by another provider, this initial triage assessment does not replace that evaluation, and the importance of remaining in the ED until their evaluation is complete.  Clinical Impression   The patient appears stable so that the remainder of the work up may be completed by another provider.      Loni Beckwith, [place] 03/24/21 1823

## 2021-03-24 NOTE — Telephone Encounter (Signed)
Phone call from patient during the weekend.   Patient reporting onset of nonstop diarrhea since yesterday.  She feels hot and cold, but does not have a known fever.  She has nausea but no vomiting.  No significant abdominal distention but does feel a little swollen. She also has persistent cough.  Denies vaginal bleeding and dysuria.   Is feeling weak.   No known ill persons at home.  Negative Covid test on 03/16/21.  I recommend evaluation in the ER, and guided her to [facility]. She will need Covid testing and probably IV hydration.   She will need a follow up call from the office on 03/26/21 to see how she is doing.

## 2021-03-24 NOTE — Discharge Instructions (Signed)
Your CT scan showed a small amount of free air and free fluid.  This is expected after your surgery.  There is no sign of infection or abscess.  Continue with fluids.  Begin advancing your diet.  Add a probiotic.  If you have bloody stools, worsening, pain, or high fever return to the ER.  Otherwise, please follow-up with your OBGYN.

## 2021-03-26 NOTE — Telephone Encounter (Signed)
I called to follow up with patient. She said she is doing fine now.  She said the ER assessed her for "infection and blockage" and gave her pain meds and anti-nausea meds and she "settled down".  She said by yesterday evening she was feeling much better.  Advised her to call us if she needs anything.

## 2021-03-26 NOTE — Telephone Encounter (Signed)
Phone note and ER visit reviewed.  Encounter closed.

## 2021-03-27 ENCOUNTER — Ambulatory Visit (INDEPENDENT_AMBULATORY_CARE_PROVIDER_SITE_OTHER): Payer: BC Managed Care – PPO | Admitting: Obstetrics and Gynecology

## 2021-03-27 ENCOUNTER — Other Ambulatory Visit: Payer: Self-pay

## 2021-03-27 ENCOUNTER — Encounter: Payer: Self-pay | Admitting: Obstetrics and Gynecology

## 2021-03-27 VITALS — BP 160/88 | HR 78 | Ht 64.0 in | Wt 166.0 lb

## 2021-03-27 DIAGNOSIS — R197 Diarrhea, unspecified: Secondary | ICD-10-CM

## 2021-03-27 DIAGNOSIS — Z90710 Acquired absence of both cervix and uterus: Secondary | ICD-10-CM

## 2021-03-27 NOTE — Progress Notes (Signed)
GYNECOLOGY  VISIT   HPI: 45 y.o.   Married  Caucasian  female   G3P0000 with Patient's last menstrual period was 02/18/2021 (exact date).   here for 1 week status post TOTAL LAPAROSCOPIC HYSTERECTOMY WITH SALPINGECTOMY WITH VAGINAL MORCELLATION OF UTERUS (N/A Abdomen) CYSTOSCOPY (N/A Bladder).  Pathology report:  Uterine fibroids, benign cervix and benign fallopian tubes.   Patient seen in ER over weekend for diarrhea and diagnosed with a virus. WBC 8.9 CT of the abdomen and pelvis was unremarkable. She did not have Covid testing.  She was treated with IVF, anti-emetic and morphine.   Still with diarrhea and intestinal pain.  30 episodes from Friday to Saturday.  Able to eat a little. Having early satiety. Hydrating with Gatorade.  States her fever broke last hs.   No vaginal bleeding.   Taking ibuprofen for pain.   GYNECOLOGIC HISTORY: Patient's last menstrual period was 02/18/2021 (exact date). Contraception:  Tubal/Hyst/Salpingectomy Menopausal hormone therapy:  none Last mammogram: Mammogram in Jan or Feb, 2021, and this was normal., 10-12 years ago had abnormal MMG in [place] with BX showing calcifications Last pap smear:2020 normaland negative HR HPV -per patient. 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 Status post laparoscopic hysterectomy 03/20/2021  . Status post laparoscopic hysterectomy 03/20/2021    Past Medical History:  Diagnosis Date  . Chronic back pain   . Dysmenorrhea   . Fibroid   . Fibromyalgia   . Headache   . History of abnormal cervical Pap smear 2010   no tx  . Irregular heart rhythm    takes propranolol managed by pcp no current cardiologist  . STD (sexually transmitted disease)    Hx of  HPV    Past Surgical History:  Procedure Laterality Date  . colonscopy  2020  . CYSTOSCOPY N/A 03/20/2021   Procedure: CYSTOSCOPY;  Surgeon: Nunzio Cobbs, MD;  Location: [facility];  Service: Gynecology;  Laterality: N/A;  . TOTAL LAPAROSCOPIC HYSTERECTOMY WITH SALPINGECTOMY N/A 03/20/2021   Procedure: TOTAL LAPAROSCOPIC HYSTERECTOMY WITH SALPINGECTOMY WITH VAGINAL MORCELLATION OF UTERUS;  Surgeon: Nunzio Cobbs, MD;  Location[facility];  Service: Gynecology;  Laterality: N/A;  . TUBAL LIGATION  19 yrs ago    Current Outpatient Medications  Medication Sig Dispense Refill  . gabapentin (NEURONTIN) 400 MG capsule Take 400 mg by mouth at bedtime.    Marland Kitchen ibuprofen (ADVIL) 800 MG tablet Take 1 tablet (800 mg total) by mouth every 8 (eight) hours as needed for moderate pain or mild pain. 30 tablet 0  . oxyCODONE-acetaminophen (PERCOCET/ROXICET) 5-325 MG tablet Take 1-2 tablets by mouth every 4 (four) hours as needed for moderate pain. 20 tablet 0  . propranolol (INDERAL) 20 MG tablet Take 20 mg by mouth 3 (three) times daily.     No current facility-administered medications for this visit.     ALLERGIES: Patient has no  known allergies.  Family History  Problem Relation Age of Onset  . Diabetes Maternal Grandfather   . Stroke Paternal Grandfather     Social History   Socioeconomic History  . Marital status: Married    Spouse name: Not on file  . Number of children: Not on file  . Years of education: Not on file  . Highest education level: Not on file  Occupational History  . Not on file  Tobacco Use  . Smoking status: Never Smoker  . Smokeless tobacco: Never Used  Vaping Use  . Vaping Use: Never used  Substance and Sexual Activity  . Alcohol use: Not Currently  . Drug use: Yes    Types: Marijuana    Comment: marijuana edibles in 2020 beginning of 2021 in [place] for fibromyalgia, last one 03/15/21   . Sexual activity:  Yes    Birth control/protection: Surgical    Comment: Tubal`  Other Topics Concern  . Not on file  Social History Narrative   ** Merged History Encounter **       Social Determinants of Health   Financial Resource Strain: Not on file  Food Insecurity: Not on file  Transportation Needs: Not on file  Physical Activity: Not on file  Stress: Not on file  Social Connections: Not on file  Intimate Partner Violence: Not on file    Review of Systems  Gastrointestinal: Positive for diarrhea.  All other systems reviewed and are negative.   PHYSICAL EXAMINATION:    BP (!) 160/88   Pulse 78   Ht 5\' 4"  (1.626 m)   Wt 166 lb (75.3 kg)   LMP 02/18/2021 (Exact Date)   SpO2 98%   BMI 28.49 kg/m     General appearance: alert, cooperative and appears stated age   Abdomen: incisions intact.  Abdomen is soft, non-tender, no masses,  no organomegaly  ASSESSMENT  Status post total laparoscopic hysterectomy with bilateral salpingectomy, vaginal morcellation of uterine specimen in Alexia bag, and cystoscopy.  Diarrhea post op. No acute abdomen.   PLAN  Surgical findings, procedure, and pathology report reviewed.  C diff testing.  Patient given instructions and sterile specimen cup.  She will deliver this to the office as soon as possible.  If C diff testing is negative, Ok to try some Imodium.  Fu for 6 week post op visit and prn.

## 2021-03-28 ENCOUNTER — Other Ambulatory Visit (INDEPENDENT_AMBULATORY_CARE_PROVIDER_SITE_OTHER): Payer: BC Managed Care – PPO

## 2021-03-28 ENCOUNTER — Other Ambulatory Visit: Payer: Self-pay

## 2021-03-28 DIAGNOSIS — D259 Leiomyoma of uterus, unspecified: Secondary | ICD-10-CM

## 2021-03-28 DIAGNOSIS — R197 Diarrhea, unspecified: Secondary | ICD-10-CM

## 2021-03-28 DIAGNOSIS — Z1231 Encounter for screening mammogram for malignant neoplasm of breast: Secondary | ICD-10-CM

## 2021-03-30 ENCOUNTER — Encounter: Payer: Self-pay | Admitting: Obstetrics and Gynecology

## 2021-04-02 ENCOUNTER — Other Ambulatory Visit: Payer: Self-pay

## 2021-04-02 ENCOUNTER — Telehealth: Payer: Self-pay | Admitting: *Deleted

## 2021-04-02 DIAGNOSIS — R197 Diarrhea, unspecified: Secondary | ICD-10-CM

## 2021-04-02 NOTE — Telephone Encounter (Signed)
See telephone encounter dated 04/02/21.   Encounter closed.

## 2021-04-02 NOTE — Telephone Encounter (Signed)
Please let the patient know that the specimen was sent this am to the lab and not last week.  It was in the freezer and was overlooked.  I apologize for the delay.  It will take 24 - 48 hours to be resulted.   In the meantime, I recommend she take Imodium A-D Take 2 tabs/caps x 1.  Then take 1 tab/cap after each loose stool.  Max 4 tabs or caps/24 hours.   She should try to hydrate well.

## 2021-04-02 NOTE — Telephone Encounter (Signed)
Spoke with patient, advised as seen below per Dr. Silva.  Patient verbalizes understanding and is agreeable.  Encounter closed.  

## 2021-04-02 NOTE — Telephone Encounter (Signed)
To: [facility]    From: CHAQUANA NICHOLS    Created: 03/30/2021 6:52 PM     *-*-*This message has not been handled.*-*-*  I was able to drop off a specimen Wednesday morning to be tested for C Diff, but the results haven't been posted yet.  Does it normally take this long?  Have a good weekend,  Jennylee Uehara     Call placed to patient. Advised Order placed on 04/02/21 for C Diff testing, can take 48 hours for results, will be notified of results once completed.   Patient reports last episode of diarrhea on 4/24. Tolerating fluids and eating. Feels like she has diarrhea 30 minutes after eating each time. Nausea, no vomiting. Fever of 99.8 on 4/22, no fever or chills since. Increase in headaches. Has not tried OTC Imodium, was waiting on test results. Reports intermittent episodes of feeling weak.   Advised patient I will update Dr. Quincy Simmonds and f/u with recommendations. Patient agreeable.   Routing to Dr. Quincy Simmonds to review and advise.   Cc: [facility] Triage Pool for f/u

## 2021-04-05 ENCOUNTER — Telehealth: Payer: Self-pay | Admitting: Obstetrics and Gynecology

## 2021-04-05 NOTE — Telephone Encounter (Signed)
Please check on status of C Diff test results for patient.

## 2021-04-05 NOTE — Telephone Encounter (Signed)
Brooke [name] said the result is still pending.

## 2021-04-05 NOTE — Telephone Encounter (Signed)
Printed order and gave to [place] in lab to check and get back with me.

## 2021-04-07 LAB — TEST AUTHORIZATION

## 2021-04-07 LAB — CLOSTRIDIUM DIFFICILE CULTURE-FECAL

## 2021-04-07 LAB — CLOSTRIDIUM DIFFICILE TOXIN B, QUALITATIVE, REAL-TIME PCR: Toxigenic C. Difficile by PCR: NOT DETECTED

## 2021-04-10 NOTE — Telephone Encounter (Signed)
Result did return negative.  Patient received communication through My Chart.

## 2021-04-24 ENCOUNTER — Ambulatory Visit
Admission: RE | Admit: 2021-04-24 | Discharge: 2021-04-24 | Disposition: A | Discharge status: Home / Self Care | Payer: BC Managed Care – PPO | Source: Ambulatory Visit

## 2021-04-24 ENCOUNTER — Other Ambulatory Visit: Payer: Self-pay

## 2021-04-24 DIAGNOSIS — Z1231 Encounter for screening mammogram for malignant neoplasm of breast: Secondary | ICD-10-CM

## 2021-05-01 ENCOUNTER — Ambulatory Visit (INDEPENDENT_AMBULATORY_CARE_PROVIDER_SITE_OTHER): Payer: BC Managed Care – PPO | Admitting: Obstetrics and Gynecology

## 2021-05-01 ENCOUNTER — Other Ambulatory Visit: Payer: Self-pay

## 2021-05-01 ENCOUNTER — Encounter: Payer: Self-pay | Admitting: Obstetrics and Gynecology

## 2021-05-01 VITALS — BP 122/72 | HR 83 | Ht 64.0 in | Wt 172.0 lb

## 2021-05-01 DIAGNOSIS — Z90710 Acquired absence of both cervix and uterus: Secondary | ICD-10-CM

## 2021-05-01 DIAGNOSIS — R300 Dysuria: Secondary | ICD-10-CM | POA: Diagnosis not present

## 2021-05-01 MED ORDER — SULFAMETHOXAZOLE-TRIMETHOPRIM 800-160 MG PO TABS: 1.0000 | ORAL_TABLET | Freq: Two times a day (BID) | ORAL | 0 refills | Status: DC

## 2021-05-01 NOTE — Progress Notes (Signed)
GYNECOLOGY  VISIT   HPI: 45 y.o.   Married  Caucasian  female   G3P0000 with Patient's last menstrual period was 02/18/2021 (exact date).   here for 6 week follow up status post   TOTAL LAPAROSCOPIC HYSTERECTOMY WITH SALPINGECTOMY WITH VAGINAL MORCELLATION OF UTERUS (N/A Abdomen) CYSTOSCOPY (N/A Bladder) - 03/20/21.   Bladder pain with urinating.  Having a spasm and crampy pain.  Does not feel the pain if she is not voiding.   Having intense cramping with diarrhea.  She has diarrhea with eating.   No vaginal bleeding.   Still has sore abdomen and uses ibuprofen as needed.   She will see a PCP in June.   GYNECOLOGIC HISTORY: Patient's last menstrual period was 02/18/2021 (exact date). Contraception:  Tubal/Hyst Menopausal hormone therapy:  none Last mammogram: 04-24-21 BI-RADS1.. Mammogram in Jan or Feb, 2021, and this was normal., 10-12 years ago had abnormal MMG in [place] with BX showing calcifications Last pap smear:  2020 normaland negative HR HPV -per patient. 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 Status post laparoscopic hysterectomy 03/20/2021  . Status post laparoscopic hysterectomy 03/20/2021    Past Medical History:  Diagnosis Date  . Chronic back pain   . Dysmenorrhea   . Fibroid   . Fibromyalgia   . Headache   . History of abnormal cervical Pap smear 2010   no tx  . Irregular heart rhythm    takes propranolol managed by pcp no current cardiologist  . STD (sexually transmitted disease)    Hx of HPV    Past Surgical History:  Procedure Laterality Date  . BREAST BIOPSY    . colonscopy  2020  . CYSTOSCOPY N/A 03/20/2021   Procedure: CYSTOSCOPY;  Surgeon: Nunzio Cobbs, MD;  Location: [facility];  Service: Gynecology;  Laterality: N/A;  . TOTAL LAPAROSCOPIC HYSTERECTOMY WITH SALPINGECTOMY N/A 03/20/2021   Procedure: TOTAL LAPAROSCOPIC HYSTERECTOMY WITH SALPINGECTOMY WITH VAGINAL MORCELLATION OF UTERUS;  Surgeon: Nunzio Cobbs, MD;  Location: [facility];  Service: Gynecology;  Laterality: N/A;  . TUBAL LIGATION  19 yrs ago    Current Outpatient Medications  Medication Sig Dispense Refill  . gabapentin (NEURONTIN) 400 MG capsule Take 400 mg by mouth at bedtime.    . propranolol (INDERAL) 20 MG tablet Take 20 mg by mouth 3 (three) times daily.     No current facility-administered medications for this visit.     ALLERGIES: Patient has no known allergies.  Family History  Problem Relation Age of Onset  . Diabetes Maternal Grandfather   . Stroke Paternal Grandfather     Social History   Socioeconomic History  . Marital status: Married    Spouse name: Not on file  . Number of children: Not on file  . Years of education: Not on file  . Highest education level: Not on file  Occupational History  .  Not on file  Tobacco Use  . Smoking status: Never Smoker  . Smokeless tobacco: Never Used  Vaping Use  . Vaping Use: Never used  Substance and Sexual Activity  . Alcohol use: Not Currently  . Drug use: Yes    Types: Marijuana    Comment: marijuana edibles in 2020 beginning of 2021 in [place] for fibromyalgia, last one 03/15/21   . Sexual activity: Yes    Birth control/protection: Surgical    Comment: Tubal`  Other Topics Concern  . Not on file  Social History Narrative   ** Merged History Encounter **       Social Determinants of Health   Financial Resource Strain: Not on file  Food Insecurity: Not on file  Transportation Needs: Not on file  Physical Activity: Not on file  Stress: Not on file  Social Connections: Not on file  Intimate Partner Violence: Not on file    Review of Systems  Genitourinary: Positive for  dysuria.  All other systems reviewed and are negative.   PHYSICAL EXAMINATION:    BP 122/72   Ht 5\' 4"  (1.626 m)   Wt 172 lb (78 kg)   LMP 02/18/2021 (Exact Date)   BMI 29.52 kg/m     General appearance: alert, cooperative and appears stated age  Abdomen: incisions intact.  Abdomen is soft, non-tender, no masses,  no organomegaly    Pelvic: External genitalia:  no lesions              Urethra:  normal appearing urethra with no masses, tenderness or lesions              Bartholins and Skenes: normal                 Vagina: normal appearing vagina with normal color and discharge, no lesions              Cervix: absent. Suture line intact.  No bleeding.                 Bimanual Exam:  Uterus:  Absent.               Adnexa: no mass, fullness, tenderness             Chaperone was present for exam.  ASSESSMENT  Status post laparoscopic hysterectomy.  Dysuria. Diarrhea.  C diff testing negative.   PLAN  Urinalysis:  SG 1.0310, 0 - 5 WBC, 0 - 2 RBC, 6 - 10 squams, moderate bacteria, moderate mucus.  UC sent.  Bactrim DS po bid x 3 days.  She will see her new PCP about her diarrhea.  Return for 12 week post op visit.    Yearly well woman visits recommended.

## 2021-05-03 LAB — CULTURE INDICATED

## 2021-05-03 LAB — URINALYSIS, COMPLETE W/RFL CULTURE
Bilirubin Urine: NEGATIVE
Glucose, UA: NEGATIVE
Hyaline Cast: NONE SEEN /LPF
Leukocyte Esterase: NEGATIVE
Nitrites, Initial: NEGATIVE
Specific Gravity, Urine: 1.038 — ABNORMAL HIGH (ref 1.001–1.035)
pH: 6 (ref 5.0–8.0)

## 2021-05-03 LAB — URINE CULTURE
MICRO NUMBER:: 11928092
Result:: NO GROWTH
SPECIMEN QUALITY:: ADEQUATE

## 2021-05-22 ENCOUNTER — Ambulatory Visit (INDEPENDENT_AMBULATORY_CARE_PROVIDER_SITE_OTHER): Payer: BC Managed Care – PPO | Admitting: Family Medicine

## 2021-05-22 ENCOUNTER — Encounter: Payer: Self-pay | Admitting: Family Medicine

## 2021-05-22 ENCOUNTER — Other Ambulatory Visit: Payer: Self-pay

## 2021-05-22 VITALS — BP 130/70 | HR 70 | Ht 64.0 in | Wt 164.8 lb

## 2021-05-22 DIAGNOSIS — Z8739 Personal history of other diseases of the musculoskeletal system and connective tissue: Secondary | ICD-10-CM | POA: Diagnosis not present

## 2021-05-22 DIAGNOSIS — M542 Cervicalgia: Secondary | ICD-10-CM | POA: Diagnosis not present

## 2021-05-22 DIAGNOSIS — M5441 Lumbago with sciatica, right side: Secondary | ICD-10-CM

## 2021-05-22 DIAGNOSIS — G44229 Chronic tension-type headache, not intractable: Secondary | ICD-10-CM

## 2021-05-22 DIAGNOSIS — Z8669 Personal history of other diseases of the nervous system and sense organs: Secondary | ICD-10-CM

## 2021-05-22 DIAGNOSIS — G8929 Other chronic pain: Secondary | ICD-10-CM

## 2021-05-22 DIAGNOSIS — K580 Irritable bowel syndrome with diarrhea: Secondary | ICD-10-CM

## 2021-05-22 MED ORDER — DICLOFENAC SODIUM 75 MG PO TBEC
75.0000 mg | DELAYED_RELEASE_TABLET | Freq: Two times a day (BID) | ORAL | 1 refills | Status: DC
Start: 2021-05-22 — End: 2021-06-29

## 2021-05-22 NOTE — Progress Notes (Signed)
Subjective:    Patient ID: Brooke [name], female    DOB: 1976/08/11, 45 y.o.   MRN: [id number]  HPI Chief Complaint  Patient presents with   new pt     New pt get established. Has fibromyalgia has gotten worse. Was on medication in the past for this   She is new to the practice and here to get established.  Previous medical care: moved here from [facility] last year. No medical records with her or available today from [place] or [place] where she was prior.   Other providers: OB/GYN - Dr. Quincy Simmonds   PMH:  IBS- diarrhea per patient. Managing ok without medication.   Fibromyalgia- states she was diagnosed with fibromyalgia in 2009 in [place].  States her PCP ruled out other health conditions such as RA and Lupus.  States she has not been on any prescription medications since 2013.  Reports using marijuana edibles at bedtime  States she was most recently taking Gabapentin and Tramadol in 2013.  She also has tried "antidepressants". Unclear as to which ones she tried.  States Flexeril and Tramadol has worked for her in the past.  Reports taking ibuprofen 800 mg daily and Tylenol at times.  Uses Arnicare topical.   States she has struggled with intense pain at the base of her skull. Neck tightness. Lower back pain that feels like a burning sensation. Occasionally the pain radiates down the back of her right leg.  States her lower back is "always" a low dull pain. Stretching improves her pain temporarily.   States she thought her hysterectomy would take care of her low back pain but it did not.    No numbness, tingling or weakness in extremities.   States she gardens, plays with her pets, and has chickens. States she is able to do her daily activities and care for her pets.   MGM with fibromyalgia.   Takes propranolol for sinus tachycardia. Her cardiologist in [place] started her on this in 2018. States the medication works and no issues with tachycardia.    Denies fever, chills, dizziness, chest pain, palpitations, shortness of breath, abdominal pain, n/v urinary symptoms, LE edema.     Social history: Lives with husband, 3 adult children, does not work currently    Colonoscopy: 2020 Last Gynecological Exam: this year   Depression screen PHQ 2/9 05/22/2021  Decreased Interest 0  Down, Depressed, Hopeless 0  PHQ - 2 Score 0      Reviewed allergies, medications, past medical, surgical, family, and social history.    Review of Systems Pertinent positives and negatives in the history of present illness.     Objective:   Physical Exam Constitutional:      General: She is not in acute distress.    Appearance: Normal appearance. She is not ill-appearing.  Eyes:     Extraocular Movements: Extraocular movements intact.     Conjunctiva/sclera: Conjunctivae normal.     Pupils: Pupils are equal, round, and reactive to light.  Neck:     Comments: Bilateral cervical muscle tenderness  Cardiovascular:     Rate and Rhythm: Normal rate and regular rhythm.     Pulses: Normal pulses.     Heart sounds: Normal heart sounds.  Pulmonary:     Effort: Pulmonary effort is normal.     Breath sounds: Normal breath sounds.  Musculoskeletal:     Right shoulder: Normal range of motion. Normal strength.     Left shoulder: Normal strength.  Cervical back: Normal range of motion and neck supple. No rigidity.     Thoracic back: Normal range of motion.     Lumbar back: Tenderness present. Normal range of motion. Negative right straight leg raise test and negative left straight leg raise test.     Comments: Lumbar paraspinal muscle TTP  Lymphadenopathy:     Cervical: No cervical adenopathy.  Skin:    General: Skin is warm and dry.     Capillary Refill: Capillary refill takes less than 2 seconds.  Neurological:     General: No focal deficit present.     Mental Status: She is alert and oriented to person, place, and time.     Cranial Nerves:  Cranial nerves are intact.     Sensory: Sensation is intact.     Motor: Motor function is intact. No seizure activity.     Gait: Gait normal.  Psychiatric:        Attention and Perception: Attention normal.        Mood and Affect: Mood normal.        Speech: Speech normal.        Behavior: Behavior normal.        Cognition and Memory: Cognition normal.   BP 130/70   Pulse 70   Ht 5\' 4"  (1.626 m)   Wt 164 lb 12.8 oz (74.8 kg)   LMP 02/18/2021 (Exact Date)   BMI 28.29 kg/m         Assessment & Plan:  Chronic bilateral low back pain with right-sided sciatica - Plan: Ambulatory referral to Sports Medicine, diclofenac (VOLTAREN) 75 MG EC tablet  Bilateral posterior neck pain - Plan: Ambulatory referral to Sports Medicine, diclofenac (VOLTAREN) 75 MG EC tablet  History of fibromyalgia - Plan: Ambulatory referral to Sports Medicine  Irritable bowel syndrome with diarrhea  Chronic tension-type headache, not intractable  History of migraine  She is new to the practice and here today to establish care.  Her main concern is chronic pain and reports having a history of fibromyalgia.  I do not have any medical records available today.   No red flag symptoms.  She reports having RA and lupus ruled out in the past when she was diagnosed with fibromyalgia. Reports taking over-the-counter pain medication since approximately 2013.  Recommend she try to get records from previous PCP who diagnosed and treated her for fibromyalgia.  Unclear as to which medications she took back then. She will stop over-the-counter NSAIDs and diclofenac prescribed.  I am also referring her to sports medicine for evaluation and treatment. Appears to be managing IBS symptoms and chronic headaches.  No migraine in years.

## 2021-05-22 NOTE — Patient Instructions (Signed)
I am referring you to sports medicine at [facility]. They will call you.

## 2021-05-24 NOTE — Progress Notes (Signed)
Subjective:    I'm seeing this patient as a consultation for:  Brooke Dingwall, NP-C. Note will be routed back to referring provider/PCP.  CC: Neck and Low back pain  I, Brooke [name], LAT, ATC, am serving as scribe for Dr. Lynne Leader.  HPI: Pt is a 45 y/o female presenting w/ c/o chronic neck and low back pain that has worsened over the last 2 years.  Pt has a hx of fibromyalgia, diagnosed in [place] state in 2009.  Neck pain:  She states that she feels like she has nerve that is being pinched on the R side of her neck w/ pain radiating across the base of her skull and upper traps. -UE numbness/tingling: hands fall asleep when she's sleeping -Aggravating factors: being still for prolonged periods of time; over activity -Treatments tried: IBU 800mg ; Tylenol; Voltaren; heat; foam roller; massage gun; stretching; pressure point massage  Low back pain: Locates her pain to her lower L-spine and sacrum and describes her sacrum as feeling like it is more post. -Radiating pain: yes, intermittently into the R LE -LE numbness/tingling: No -Aggravating factors: Lifting; raking -Treatments tried: IBU 800mg ; Tylenol; Voltaren; stretching  Diagnostic testing: CT abdomen/pelvis - 03/24/21  She also reports B heel pain, stating that she feels like she's stepping on rocks.  Past medical history, Surgical history, Family history, Social history, Allergies, and medications have been entered into the medical record, reviewed.   Review of Systems: No new headache, visual changes, nausea, vomiting, diarrhea, constipation, dizziness, abdominal pain, skin rash, fevers, chills, night sweats, weight loss, swollen lymph nodes, body aches, joint swelling, muscle aches, chest pain, shortness of breath, mood changes, visual or auditory hallucinations.   Objective:    Vitals:   05/25/21 0917  BP: 140/90  Pulse: 76  SpO2: 98%   General: Well Developed, well nourished, and in no acute distress.   Neuro/Psych: Alert and oriented x3, extra-ocular muscles intact, able to move all 4 extremities, sensation grossly intact. Skin: Warm and dry, no rashes noted.  Respiratory: Not using accessory muscles, speaking in full sentences, trachea midline.  Cardiovascular: Pulses palpable, no extremity edema. Abdomen: Does not appear distended. MSK: C-spine normal.  Nontender midline.  Normal cervical motion.  Upper extremity strength reflexes and sensation are equal and normal throughout. Normal scapular motion bilaterally.  L-spine normal-appearing nontender midline.  Decreased lumbar motion. Lower extremity strength reflexes and sensation are intact throughout.  Lab and Radiology Results  X-ray images C-spine obtained today personally and independently interpreted Reversal of cervical lordosis indicating cervical spasm.  No acute fractures.  Minimal degenerative changes. Await formal radiology review  CT images lumbar spine obtained during CT scan abdomen pelvis obtained in April 2022 personally and independently interpreted. Mild facet DJD L5-S1 and L4-L5 bilaterally.  No acute fractures no malalignment.  Mild thoracic anterior spurring.      Impression and Recommendations:    Assessment and Plan: 45 y.o. female with chronic neck and low back pain in the setting of fibromyalgia.  Patient has had trials of medication for fibromyalgia with gabapentin and tramadol.  She is also doing some limited home exercise and stretching program for her neck and back and but has not had a comprehensive physical therapy trial for her neck and back pain yet.  I think this is a great idea and she is an excellent candidate for physical therapy for her chronic neck and low back pain.  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  Additionally discussed a holistic  approach to fibromyalgia as well.  Physical activity is essential.  Fortunately she is already doing this.  Also recommend consider cognitive behavioral therapy for chronic pain management.  She is thinking about this and I provided some resources as well. Lastly we discussed medication management.  She had trialed gabapentin and tramadol.  Certainly could use Cymbalta and Lyrica however if we could avoid medications I think it would be great.  Recheck in 6 weeks.   PDMP not reviewed this encounter. Orders Placed This Encounter  Procedures   DG Cervical Spine 2 or 3 views    Standing Status:   Future    Number of Occurrences:   1    Standing Expiration Date:   06/24/2021    Order Specific Question:   Reason for Exam (SYMPTOM  OR DIAGNOSIS REQUIRED)    Answer:   Neck pain    Order Specific Question:   Is patient pregnant?    Answer:   No    Order Specific Question:   Preferred imaging location?    Answer:   [facility]   Ambulatory referral to Physical Therapy    Referral Priority:   Routine    Referral Type:   Physical Medicine    Referral Reason:   Specialty Services Required    Requested Specialty:   Physical Therapy    Number of Visits Requested:   1   No orders of the defined types were placed in this encounter.   Discussed warning signs or symptoms. Please see discharge instructions. Patient expresses understanding.   The above documentation has been reviewed and is accurate and complete Lynne Leader, M.D.  Total encounter time 45 minutes including face-to-face time with the patient and, reviewing past medical record, and charting on the date of service.

## 2021-05-25 ENCOUNTER — Ambulatory Visit (INDEPENDENT_AMBULATORY_CARE_PROVIDER_SITE_OTHER): Payer: BC Managed Care – PPO | Admitting: Family Medicine

## 2021-05-25 ENCOUNTER — Ambulatory Visit (INDEPENDENT_AMBULATORY_CARE_PROVIDER_SITE_OTHER): Payer: BC Managed Care – PPO

## 2021-05-25 ENCOUNTER — Encounter: Payer: Self-pay | Admitting: Family Medicine

## 2021-05-25 ENCOUNTER — Other Ambulatory Visit: Payer: Self-pay

## 2021-05-25 VITALS — BP 140/90 | HR 76 | Ht 64.0 in | Wt 164.2 lb

## 2021-05-25 DIAGNOSIS — M542 Cervicalgia: Secondary | ICD-10-CM | POA: Diagnosis not present

## 2021-05-25 DIAGNOSIS — M797 Fibromyalgia: Secondary | ICD-10-CM

## 2021-05-25 DIAGNOSIS — M545 Low back pain, unspecified: Secondary | ICD-10-CM | POA: Diagnosis not present

## 2021-05-25 DIAGNOSIS — G8929 Other chronic pain: Secondary | ICD-10-CM | POA: Diagnosis not present

## 2021-05-25 DIAGNOSIS — M5441 Lumbago with sciatica, right side: Secondary | ICD-10-CM | POA: Diagnosis not present

## 2021-05-25 NOTE — Patient Instructions (Signed)
Thank you for coming in today.   I've referred you to Physical Therapy.  Let us know if you don't hear from them in one week.   Please get an Xray today before you leave

## 2021-05-28 NOTE — Progress Notes (Signed)
X-ray cervical spine shows some mild arthritis at C5-C6.

## 2021-05-31 ENCOUNTER — Ambulatory Visit: Payer: BC Managed Care – PPO | Admitting: Family Medicine

## 2021-05-31 DIAGNOSIS — M25512 Pain in left shoulder: Secondary | ICD-10-CM | POA: Diagnosis not present

## 2021-05-31 DIAGNOSIS — M545 Low back pain, unspecified: Secondary | ICD-10-CM | POA: Diagnosis not present

## 2021-05-31 DIAGNOSIS — M542 Cervicalgia: Secondary | ICD-10-CM | POA: Diagnosis not present

## 2021-05-31 DIAGNOSIS — M25511 Pain in right shoulder: Secondary | ICD-10-CM | POA: Diagnosis not present

## 2021-06-03 ENCOUNTER — Other Ambulatory Visit: Payer: Self-pay

## 2021-06-03 ENCOUNTER — Ambulatory Visit
Admission: EM | Admit: 2021-06-03 | Discharge: 2021-06-03 | Disposition: A | Discharge status: Home / Self Care | Payer: BC Managed Care – PPO | Attending: Emergency Medicine | Admitting: Emergency Medicine

## 2021-06-03 DIAGNOSIS — W57XXXA Bitten or stung by nonvenomous insect and other nonvenomous arthropods, initial encounter: Secondary | ICD-10-CM | POA: Diagnosis not present

## 2021-06-03 DIAGNOSIS — R21 Rash and other nonspecific skin eruption: Secondary | ICD-10-CM

## 2021-06-03 MED ORDER — TRIAMCINOLONE ACETONIDE 0.1 % EX CREA: 1.0000 "application " | TOPICAL_CREAM | Freq: Two times a day (BID) | CUTANEOUS | 0 refills | Status: DC

## 2021-06-03 MED ORDER — DOXYCYCLINE HYCLATE 100 MG PO CAPS: 100.0000 mg | ORAL_CAPSULE | Freq: Two times a day (BID) | ORAL | 0 refills | Status: AC

## 2021-06-03 NOTE — ED Triage Notes (Signed)
Patient presents to Urgent Care with complaints of a rash located on her right chest area since Tuesday. She states rash has increased in size and has become more painful. Treating with benadryl with no relief. She states only changes she has made Voltaren medication.   Denies fever.

## 2021-06-03 NOTE — Discharge Instructions (Addendum)
Your lab work will be back in about 7 to 10 days.  I am going to go ahead and empirically treat you for tickborne disease.  If your Lyme titers are positive, we will extend your doxycycline.  In the meantime, take 1000 mg of Tylenol along with the Voltaren to help with the pain.  Triamcinolone cream will help with itching.  You can also try some Claritin or Zyrtec.

## 2021-06-03 NOTE — ED Provider Notes (Signed)
HPI  SUBJECTIVE:  Brooke [name] is a 45 y.o. female who presents with an erythematous, pruritic, painful rash on her right breast.  No preceding paresthesia, no chest pain in a dermatomal distribution.  No back pain, body aches, headaches, fevers.  She removed 2 ticks last week, 1 on her right breast and 1 on her thigh.  No new lotions, soaps, detergent.  She started Voltaren tabs 2 weeks ago.  No known exposure to poison ivy, poison oak.  There is no rash anywhere else.  No antibiotics in the past month.  No antipyretic in the past 6 hours.  She tried Benadryl and hydrocortisone cream without improvement in her symptoms.  Symptoms are worse with wearing clothing.  She has a past medical history including varicella and fibromyalgia.  No history of shingles.LMP: Status post hysterectomy.  PFX:TKWIOX, Vickie L, NP-C  Past Medical History:  Diagnosis Date   Chronic back pain    Dysmenorrhea    Fibroid    Fibromyalgia    Headache    History of abnormal cervical Pap smear 2010   no tx   Irregular heart rhythm    takes propranolol managed by pcp no current cardiologist   STD (sexually transmitted disease)    Hx of HPV    Past Surgical History:  Procedure Laterality Date   BREAST BIOPSY     colonscopy  2020   CYSTOSCOPY N/A 03/20/2021   Procedure: CYSTOSCOPY;  Surgeon: Nunzio Cobbs, MD;  Location[facility];  Service: Gynecology;  Laterality: N/A;   TOTAL LAPAROSCOPIC HYSTERECTOMY WITH SALPINGECTOMY N/A 03/20/2021   Procedure: TOTAL LAPAROSCOPIC HYSTERECTOMY WITH SALPINGECTOMY WITH VAGINAL MORCELLATION OF UTERUS;  Surgeon: Nunzio Cobbs, MD;  Location: [facility];  Service: Gynecology;  Laterality: N/A;   TUBAL LIGATION  19 yrs ago    Family History  Problem Relation Age of Onset   Diabetes Maternal Grandfather    Stroke Paternal Grandfather     Social History   Tobacco Use   Smoking status: Never   Smokeless tobacco: Never   Vaping Use   Vaping Use: Never used  Substance Use Topics   Alcohol use: Not Currently   Drug use: Yes    Types: Marijuana    Comment: marijuana edibles in 2020 beginning of 2021 in [place] for fibromyalgia, last one 03/15/21     No current facility-administered medications for this encounter.  Current Outpatient Medications:    doxycycline (VIBRAMYCIN) 100 MG capsule, Take 1 capsule (100 mg total) by mouth 2 (two) times daily for 10 days., Disp: 20 capsule, Rfl: 0   triamcinolone cream (KENALOG) 0.1 %, Apply 1 application topically 2 (two) times daily. Apply for 2 weeks. May use on face, Disp: 30 g, Rfl: 0   diclofenac (VOLTAREN) 75 MG EC tablet, Take 1 tablet (75 mg total) by mouth 2 (two) times daily., Disp: 30 tablet, Rfl: 1   propranolol (INDERAL) 20 MG tablet, Take 20 mg by mouth 3 (three) times daily., Disp: , Rfl:   No Known Allergies   ROS  As noted in HPI.   Physical Exam  BP (!) 138/93 (BP Location: Right Arm)   Pulse 81   Temp 98.2 F (36.8 C) (Oral)   Resp 16   LMP 02/18/2021 (Exact Date)   SpO2 97%   Constitutional: Well developed, well nourished, no acute distress Eyes:  EOMI, conjunctiva normal bilaterally HENT: Normocephalic, atraumatic,mucus membranes moist Respiratory: Normal inspiratory effort Cardiovascular:  Normal rate GI: nondistended skin: 11x 5 cm tender area of circular erythema with no induration on right breast.  Central clearing.  No blisters, crusting, excoriations.  Marked area of erythema with a marker for reference    Musculoskeletal: no deformities Neurologic: Alert & oriented x 3, no focal neuro deficits Psychiatric: Speech and behavior appropriate   ED Course   Medications - No data to display  No orders of the defined types were placed in this encounter.   No results found for this or any previous visit (from the past 24 hour(s)). No results found.  ED Clinical Impression  1. Rash and nonspecific skin eruption   2.  Tick bite of multiple sites      ED Assessment/Plan  Concern for erythema migrans/tickborne disease.  R MSF and Lyme titers sent.  Home with Tylenol, triamcinolone cream, doxycycline for 10 days.  We will extend doxycycline to 3-4 week if Lyme titers come back positive.   Discussed labs, MDM, treatment plan, and plan for follow-up with patient. Discussed sn/sx that should prompt return to the ED. patient agrees with plan.   Meds ordered this encounter  Medications   doxycycline (VIBRAMYCIN) 100 MG capsule    Sig: Take 1 capsule (100 mg total) by mouth 2 (two) times daily for 10 days.    Dispense:  20 capsule    Refill:  0   triamcinolone cream (KENALOG) 0.1 %    Sig: Apply 1 application topically 2 (two) times daily. Apply for 2 weeks. May use on face    Dispense:  30 g    Refill:  0      *This clinic note was created using [employer]. Therefore, there may be occasional mistakes despite careful proofreading.  ?    Melynda Ripple, MD 06/04/21 [phone]

## 2021-06-05 DIAGNOSIS — M542 Cervicalgia: Secondary | ICD-10-CM | POA: Diagnosis not present

## 2021-06-05 DIAGNOSIS — M25512 Pain in left shoulder: Secondary | ICD-10-CM | POA: Diagnosis not present

## 2021-06-05 DIAGNOSIS — M25511 Pain in right shoulder: Secondary | ICD-10-CM | POA: Diagnosis not present

## 2021-06-05 DIAGNOSIS — M545 Low back pain, unspecified: Secondary | ICD-10-CM | POA: Diagnosis not present

## 2021-06-07 DIAGNOSIS — M25511 Pain in right shoulder: Secondary | ICD-10-CM | POA: Diagnosis not present

## 2021-06-07 DIAGNOSIS — M542 Cervicalgia: Secondary | ICD-10-CM | POA: Diagnosis not present

## 2021-06-07 DIAGNOSIS — M545 Low back pain, unspecified: Secondary | ICD-10-CM | POA: Diagnosis not present

## 2021-06-07 DIAGNOSIS — M25512 Pain in left shoulder: Secondary | ICD-10-CM | POA: Diagnosis not present

## 2021-06-07 LAB — ROCKY MTN SPOTTED FVR ABS PNL(IGG+IGM)
RMSF IgG: NEGATIVE
RMSF IgM: 0.26 index (ref 0.00–0.89)

## 2021-06-07 LAB — LYME DISEASE SEROLOGY W/REFLEX: Lyme Total Antibody EIA: NEGATIVE

## 2021-06-08 DIAGNOSIS — M25511 Pain in right shoulder: Secondary | ICD-10-CM | POA: Diagnosis not present

## 2021-06-08 DIAGNOSIS — M25512 Pain in left shoulder: Secondary | ICD-10-CM | POA: Diagnosis not present

## 2021-06-08 DIAGNOSIS — M545 Low back pain, unspecified: Secondary | ICD-10-CM | POA: Diagnosis not present

## 2021-06-08 DIAGNOSIS — M542 Cervicalgia: Secondary | ICD-10-CM | POA: Diagnosis not present

## 2021-06-12 DIAGNOSIS — M542 Cervicalgia: Secondary | ICD-10-CM | POA: Diagnosis not present

## 2021-06-12 DIAGNOSIS — M545 Low back pain, unspecified: Secondary | ICD-10-CM | POA: Diagnosis not present

## 2021-06-12 DIAGNOSIS — M25511 Pain in right shoulder: Secondary | ICD-10-CM | POA: Diagnosis not present

## 2021-06-12 DIAGNOSIS — M25512 Pain in left shoulder: Secondary | ICD-10-CM | POA: Diagnosis not present

## 2021-06-14 ENCOUNTER — Encounter: Payer: Self-pay | Admitting: Obstetrics and Gynecology

## 2021-06-14 ENCOUNTER — Other Ambulatory Visit: Payer: Self-pay

## 2021-06-14 ENCOUNTER — Ambulatory Visit (INDEPENDENT_AMBULATORY_CARE_PROVIDER_SITE_OTHER): Payer: BC Managed Care – PPO | Admitting: Obstetrics and Gynecology

## 2021-06-14 VITALS — BP 128/70 | HR 74 | Ht 64.0 in | Wt 172.0 lb

## 2021-06-14 DIAGNOSIS — M25512 Pain in left shoulder: Secondary | ICD-10-CM | POA: Diagnosis not present

## 2021-06-14 DIAGNOSIS — M542 Cervicalgia: Secondary | ICD-10-CM | POA: Diagnosis not present

## 2021-06-14 DIAGNOSIS — K529 Noninfective gastroenteritis and colitis, unspecified: Secondary | ICD-10-CM

## 2021-06-14 DIAGNOSIS — Z9889 Other specified postprocedural states: Secondary | ICD-10-CM

## 2021-06-14 DIAGNOSIS — M25511 Pain in right shoulder: Secondary | ICD-10-CM | POA: Diagnosis not present

## 2021-06-14 DIAGNOSIS — R109 Unspecified abdominal pain: Secondary | ICD-10-CM

## 2021-06-14 DIAGNOSIS — M545 Low back pain, unspecified: Secondary | ICD-10-CM | POA: Diagnosis not present

## 2021-06-14 NOTE — Progress Notes (Signed)
GYNECOLOGY  VISIT   HPI: 45 y.o.   Married  Caucasian  female   [id number] with Patient's last menstrual period was 02/18/2021 (exact date).   here for 12 week status post TOTAL LAPAROSCOPIC HYSTERECTOMY WITH SALPINGECTOMY WITH VAGINAL MORCELLATION OF UTERUS (Abdomen)  CYSTOSCOPY (Bladder).  No vaginal bleeding.   She has cramping  Cramping with voiding.  Patient has ongoing diarrhea, improved since surgery and is now once a day. When she has diarrhea, she has cramping.  She feels like she is having contractions similar to childbirth.  Saw a GI in [place] prior to moving to [facility] and had a normal colonoscopy.  Her dx with IBS.  No medication.   Going to [place] to see her daughter and 79 year old grandson  GYNECOLOGIC HISTORY: Patient's last menstrual period was 02/18/2021 (exact date). Contraception:  tubal/Hyst Menopausal hormone therapy:  none Last mammogram:   04-24-21 BI-RADS1..  Mammogram in Jan or Feb, 2021, and this was normal.,  10-12 years ago had abnormal MMG in [place] with BX showing calcifications Last pap smear:   2020 normal and negative HR HPV - per patient. Hx of Pos HR HPV with colpo/and no treatment 2010        OB History     Gravida  3   Para  3   Term  0   Preterm  0   AB  0   Living         SAB  0   IAB  0   Ectopic  0   Multiple      Live Births                 Patient Active Problem List   Diagnosis Date Noted   Neck pain, chronic 05/25/2021   Chronic low back pain 05/25/2021   Status post laparoscopic hysterectomy 03/20/2021   Fibromyalgia 05/21/2008    Past Medical History:  Diagnosis Date   Chronic back pain    Dysmenorrhea    Fibroid    Fibromyalgia    Headache    History of abnormal cervical Pap smear 2010   no tx   Irregular heart rhythm    takes propranolol managed by pcp no current cardiologist   STD (sexually transmitted disease)    Hx of HPV    Past Surgical History:  Procedure Laterality Date   BREAST  BIOPSY     colonscopy  2020   CYSTOSCOPY N/A 03/20/2021   Procedure: CYSTOSCOPY;  Surgeon: Nunzio Cobbs, MD;  Location: [facility];  Service: Gynecology;  Laterality: N/A;   TOTAL LAPAROSCOPIC HYSTERECTOMY WITH SALPINGECTOMY N/A 03/20/2021   Procedure: TOTAL LAPAROSCOPIC HYSTERECTOMY WITH SALPINGECTOMY WITH VAGINAL MORCELLATION OF UTERUS;  Surgeon: Nunzio Cobbs, MD;  Location: [facility];  Service: Gynecology;  Laterality: N/A;   TUBAL LIGATION  19 yrs ago    Current Outpatient Medications  Medication Sig Dispense Refill   diclofenac (VOLTAREN) 75 MG EC tablet Take 1 tablet (75 mg total) by mouth 2 (two) times daily. 30 tablet 1   doxycycline (VIBRAMYCIN) 100 MG capsule Take 100 mg by mouth 2 (two) times daily.     propranolol (INDERAL) 20 MG tablet Take 20 mg by mouth 3 (three) times daily.     No current facility-administered medications for this visit.     ALLERGIES: Patient has no known allergies.  Family History  Problem Relation Age of Onset   Diabetes Maternal Grandfather  Stroke Paternal Grandfather     Social History   Socioeconomic History   Marital status: Married    Spouse name: Not on file   Number of children: Not on file   Years of education: Not on file   Highest education level: Not on file  Occupational History   Not on file  Tobacco Use   Smoking status: Never   Smokeless tobacco: Never  Vaping Use   Vaping Use: Never used  Substance and Sexual Activity   Alcohol use: Not Currently   Drug use: Yes    Types: Marijuana    Comment: marijuana edibles in 2020 beginning of 2021 in [place] for fibromyalgia, last one 03/15/21    Sexual activity: Yes    Birth control/protection: Surgical    Comment: Tubal`  Other Topics Concern   Not on file  Social History Narrative   ** Merged History Encounter **       Social Determinants of Health   Financial Resource Strain: Not on file  Food Insecurity:  Not on file  Transportation Needs: Not on file  Physical Activity: Not on file  Stress: Not on file  Social Connections: Not on file  Intimate Partner Violence: Not on file    Review of Systems  All other systems reviewed and are negative.  PHYSICAL EXAMINATION:    BP 128/70   Pulse 74   Ht 5\' 4"  (1.626 m)   Wt 172 lb (78 kg)   LMP 02/18/2021 (Exact Date)   SpO2 98%   BMI 29.52 kg/m     General appearance: alert, cooperative and appears stated age   Pelvic: External genitalia:  no lesions              Urethra:  normal appearing urethra with no masses, tenderness or lesions              Bartholins and Skenes: normal                 Vagina: normal appearing vagina with normal color and discharge, no lesions              Cervix: absent.  Cuff intact.  Small pieces of suture right and left apex.  Small piece of suture fragment on Q-tip.                 Bimanual Exam:  Uterus:  absent              Adnexa: no mass, fullness, tenderness      Chaperone was present for exam.  ASSESSMENT  Status post laparoscopic hysterectomy, bilateral salpingectomy, vaginal morcellation of uterus in Alexis bag, cystoscopy.  Chronic diarrhea and abdominal cramping.    PLAN  Ok to return to all normal activity in 1 week.  Referral to gastroenterology.  She will let me know if she would like to do pelvic floor therapy.  Annual exam in April, 2023.

## 2021-06-15 DIAGNOSIS — M545 Low back pain, unspecified: Secondary | ICD-10-CM | POA: Diagnosis not present

## 2021-06-15 DIAGNOSIS — M542 Cervicalgia: Secondary | ICD-10-CM | POA: Diagnosis not present

## 2021-06-15 DIAGNOSIS — M25511 Pain in right shoulder: Secondary | ICD-10-CM | POA: Diagnosis not present

## 2021-06-15 DIAGNOSIS — M25512 Pain in left shoulder: Secondary | ICD-10-CM | POA: Diagnosis not present

## 2021-06-19 DIAGNOSIS — M545 Low back pain, unspecified: Secondary | ICD-10-CM | POA: Diagnosis not present

## 2021-06-19 DIAGNOSIS — M25512 Pain in left shoulder: Secondary | ICD-10-CM | POA: Diagnosis not present

## 2021-06-19 DIAGNOSIS — M25511 Pain in right shoulder: Secondary | ICD-10-CM | POA: Diagnosis not present

## 2021-06-19 DIAGNOSIS — M542 Cervicalgia: Secondary | ICD-10-CM | POA: Diagnosis not present

## 2021-06-21 DIAGNOSIS — M542 Cervicalgia: Secondary | ICD-10-CM | POA: Diagnosis not present

## 2021-06-21 DIAGNOSIS — M545 Low back pain, unspecified: Secondary | ICD-10-CM | POA: Diagnosis not present

## 2021-06-21 DIAGNOSIS — M25512 Pain in left shoulder: Secondary | ICD-10-CM | POA: Diagnosis not present

## 2021-06-21 DIAGNOSIS — M25511 Pain in right shoulder: Secondary | ICD-10-CM | POA: Diagnosis not present

## 2021-06-22 DIAGNOSIS — M542 Cervicalgia: Secondary | ICD-10-CM | POA: Diagnosis not present

## 2021-06-22 DIAGNOSIS — M545 Low back pain, unspecified: Secondary | ICD-10-CM | POA: Diagnosis not present

## 2021-06-22 DIAGNOSIS — M25512 Pain in left shoulder: Secondary | ICD-10-CM | POA: Diagnosis not present

## 2021-06-22 DIAGNOSIS — M25511 Pain in right shoulder: Secondary | ICD-10-CM | POA: Diagnosis not present

## 2021-06-26 DIAGNOSIS — M25511 Pain in right shoulder: Secondary | ICD-10-CM | POA: Diagnosis not present

## 2021-06-26 DIAGNOSIS — M545 Low back pain, unspecified: Secondary | ICD-10-CM | POA: Diagnosis not present

## 2021-06-26 DIAGNOSIS — M25512 Pain in left shoulder: Secondary | ICD-10-CM | POA: Diagnosis not present

## 2021-06-26 DIAGNOSIS — M542 Cervicalgia: Secondary | ICD-10-CM | POA: Diagnosis not present

## 2021-06-28 DIAGNOSIS — M542 Cervicalgia: Secondary | ICD-10-CM | POA: Diagnosis not present

## 2021-06-28 DIAGNOSIS — M25511 Pain in right shoulder: Secondary | ICD-10-CM | POA: Diagnosis not present

## 2021-06-28 DIAGNOSIS — M545 Low back pain, unspecified: Secondary | ICD-10-CM | POA: Diagnosis not present

## 2021-06-28 DIAGNOSIS — M25512 Pain in left shoulder: Secondary | ICD-10-CM | POA: Diagnosis not present

## 2021-06-28 NOTE — Progress Notes (Signed)
   I, Peterson Lombard, LAT, ATC acting as a scribe for Lynne Leader, MD.  SHALYN KORAL is a 45 y.o. female who presents to [place] at [facility] today for f/u of chronic neck and low back pain in the setting of fibromyalgia.  She was last seen by Dr. Georgina Snell on 05/25/21 and was referred to [facility] and completed about 15 visits.  Since her last visit, pt reports pain at base of skull and LBP is improved. Pt c/o tightness and pain in bilat trapz and mid-back. No UE numbness/tingling noted  Diagnostic imaging: C-spine XR- 05/25/21  Pertinent review of systems: No fevers or chills  Relevant historical information: Fibromyalgia   Exam:  BP 126/82   Pulse 77   Ht 5\' 4"  (1.626 m)   Wt 161 lb (73 kg)   LMP 02/18/2021 (Exact Date)   SpO2 99%   BMI 27.64 kg/m  General: Well Developed, well nourished, and in no acute distress.   MSK: C-spine normal motion L-spine normal motion. Normal gait    Lab and Radiology Results   EXAM: CERVICAL SPINE - 2-3 VIEW   COMPARISON:  None.   FINDINGS: There is no evidence of acute cervical spine fracture or prevertebral soft tissue swelling. Reversal of the normal cervical spine lordosis is noted. Very mild anterior endplate sclerosis is seen at the level of C5-C6. Very mild intervertebral disc space narrowing is also seen at the level of C5-C6. No other significant bone abnormalities are identified.   IMPRESSION: 1. Very mild degenerative changes at the level of C5-C6.     Electronically Signed   By: Virgina Norfolk M.D.   On: 05/25/2021 19:47 I, Lynne Leader, personally (independently) visualized and performed the interpretation of the images attached in this note.  From my office visit 05/25/21 CT images lumbar spine obtained during CT scan abdomen pelvis obtained in April 2022 personally and independently interpreted on 05/25/21 Mild facet DJD L5-S1 and L4-L5 bilaterally.  No acute fractures no malalignment.  Mild  thoracic anterior spurring.     Assessment and Plan: 45 y.o. female with chronic neck and low back pain.  Improved with physical therapy but still present.  Pain multifactorially due to fibromyalgia and some degenerative changes and muscle dysfunction.  Improving with PT but still present.  Plan to finish out PT and continue home exercise program.  Next steps could include medication management with the Cymbalta and/or Lyrica.  Additionally could proceed with MRI C-spine or L-spine to proceed with facet injection planning  After discussion with patient proceed with continued PT and watchful waiting.  Recheck as needed.  Happy to start these interventions with the MyChart message or phone call prior to the next visit as needed.  Additionally recommended cognitive behavioral therapy for management of chronic pain.  Total encounter time 20 minutes including face-to-face time with the patient and, reviewing past medical record, and charting on the date of service.   Treatment plan and options   Discussed warning signs or symptoms. Please see discharge instructions. Patient expresses understanding.   The above documentation has been reviewed and is accurate and complete Lynne Leader, M.D.

## 2021-06-29 ENCOUNTER — Encounter: Payer: Self-pay | Admitting: Gastroenterology

## 2021-06-29 ENCOUNTER — Ambulatory Visit (INDEPENDENT_AMBULATORY_CARE_PROVIDER_SITE_OTHER): Payer: BC Managed Care – PPO | Admitting: Family Medicine

## 2021-06-29 ENCOUNTER — Other Ambulatory Visit: Payer: Self-pay

## 2021-06-29 VITALS — BP 126/82 | HR 77 | Ht 64.0 in | Wt 161.0 lb

## 2021-06-29 DIAGNOSIS — M797 Fibromyalgia: Secondary | ICD-10-CM

## 2021-06-29 DIAGNOSIS — M5441 Lumbago with sciatica, right side: Secondary | ICD-10-CM | POA: Diagnosis not present

## 2021-06-29 DIAGNOSIS — M542 Cervicalgia: Secondary | ICD-10-CM | POA: Diagnosis not present

## 2021-06-29 DIAGNOSIS — G8929 Other chronic pain: Secondary | ICD-10-CM

## 2021-06-29 NOTE — Patient Instructions (Addendum)
Thank you for coming in today.   Reasonable to add vit D. 1000-5000 units daily.   Stay activity.   Finish out PT and transition to home exercise/ outpatient massage therapy or other less intensive treatments.   We can add Cymbalta or lyrica as needed.   I can start the process for injections. Typically will get MRI first.   Let me know what you want.   Recheck as needed.   I am happy to re-auth PT in the future.   I am happy to get medicine started in the futre with mychart message.   Also consider mental heath therapy for chronic pain. CBT can help and is evidence based. You can search for LCSWs that offer CBT for chronic pain. If you find one you like who accepts your insurance let me know and I will place a referral.

## 2021-07-03 DIAGNOSIS — M25512 Pain in left shoulder: Secondary | ICD-10-CM | POA: Diagnosis not present

## 2021-07-03 DIAGNOSIS — M545 Low back pain, unspecified: Secondary | ICD-10-CM | POA: Diagnosis not present

## 2021-07-03 DIAGNOSIS — M542 Cervicalgia: Secondary | ICD-10-CM | POA: Diagnosis not present

## 2021-07-03 DIAGNOSIS — M25511 Pain in right shoulder: Secondary | ICD-10-CM | POA: Diagnosis not present

## 2021-07-06 DIAGNOSIS — M542 Cervicalgia: Secondary | ICD-10-CM | POA: Diagnosis not present

## 2021-07-06 DIAGNOSIS — M25511 Pain in right shoulder: Secondary | ICD-10-CM | POA: Diagnosis not present

## 2021-07-06 DIAGNOSIS — M25512 Pain in left shoulder: Secondary | ICD-10-CM | POA: Diagnosis not present

## 2021-07-06 DIAGNOSIS — M545 Low back pain, unspecified: Secondary | ICD-10-CM | POA: Diagnosis not present

## 2021-07-10 DIAGNOSIS — M545 Low back pain, unspecified: Secondary | ICD-10-CM | POA: Diagnosis not present

## 2021-07-10 DIAGNOSIS — M25511 Pain in right shoulder: Secondary | ICD-10-CM | POA: Diagnosis not present

## 2021-07-10 DIAGNOSIS — M542 Cervicalgia: Secondary | ICD-10-CM | POA: Diagnosis not present

## 2021-07-10 DIAGNOSIS — M25512 Pain in left shoulder: Secondary | ICD-10-CM | POA: Diagnosis not present

## 2021-07-13 DIAGNOSIS — M25512 Pain in left shoulder: Secondary | ICD-10-CM | POA: Diagnosis not present

## 2021-07-13 DIAGNOSIS — M542 Cervicalgia: Secondary | ICD-10-CM | POA: Diagnosis not present

## 2021-07-13 DIAGNOSIS — M545 Low back pain, unspecified: Secondary | ICD-10-CM | POA: Diagnosis not present

## 2021-07-13 DIAGNOSIS — M25511 Pain in right shoulder: Secondary | ICD-10-CM | POA: Diagnosis not present

## 2021-07-16 DIAGNOSIS — M25511 Pain in right shoulder: Secondary | ICD-10-CM | POA: Diagnosis not present

## 2021-07-16 DIAGNOSIS — M25512 Pain in left shoulder: Secondary | ICD-10-CM | POA: Diagnosis not present

## 2021-07-16 DIAGNOSIS — M545 Low back pain, unspecified: Secondary | ICD-10-CM | POA: Diagnosis not present

## 2021-07-16 DIAGNOSIS — M542 Cervicalgia: Secondary | ICD-10-CM | POA: Diagnosis not present

## 2021-07-18 ENCOUNTER — Encounter: Payer: Self-pay | Admitting: Obstetrics and Gynecology

## 2021-07-18 ENCOUNTER — Other Ambulatory Visit: Payer: Self-pay

## 2021-07-18 ENCOUNTER — Ambulatory Visit (INDEPENDENT_AMBULATORY_CARE_PROVIDER_SITE_OTHER): Payer: BC Managed Care – PPO | Admitting: Obstetrics and Gynecology

## 2021-07-18 VITALS — BP 110/80 | HR 87 | Ht 64.0 in | Wt 172.0 lb

## 2021-07-18 DIAGNOSIS — N952 Postmenopausal atrophic vaginitis: Secondary | ICD-10-CM | POA: Diagnosis not present

## 2021-07-18 DIAGNOSIS — N760 Acute vaginitis: Secondary | ICD-10-CM | POA: Diagnosis not present

## 2021-07-18 DIAGNOSIS — B9689 Other specified bacterial agents as the cause of diseases classified elsewhere: Secondary | ICD-10-CM

## 2021-07-18 LAB — WET PREP FOR TRICH, YEAST, CLUE

## 2021-07-18 MED ORDER — METRONIDAZOLE 500 MG PO TABS: 500.0000 mg | ORAL_TABLET | Freq: Two times a day (BID) | ORAL | 0 refills | Status: DC

## 2021-07-18 MED ORDER — ESTRADIOL 0.1 MG/GM VA CREA: TOPICAL_CREAM | VAGINAL | 2 refills | Status: DC

## 2021-07-18 NOTE — Progress Notes (Signed)
GYNECOLOGY  VISIT   HPI: 45 y.o.   Married  Caucasian  female   G3P0000 with Patient's last menstrual period was 02/18/2021 (exact date).   here for vaginal itching/irritation.   Burning, itching and pain with intercourse that lasts for 1.5 weeks after intercourse.  Does not feel dry.  Notes less vaginal discharge.   Did an OTC treatment of Monistat 3, 3 weeks ago.  This did not help.   Wiping is uncomfortable.   No new exposures.   Had Doxycycline recently due to a rash and Amoxicillin for a tooth infection.   GYNECOLOGIC HISTORY: Patient's last menstrual period was 02/18/2021 (exact date). Contraception:  Tubal/Hyst Menopausal hormone therapy:  none Last mammogram: 05-01-21 3D/biRads1 Last pap smear: 2020 Neg:Neg HR HPV per patient. Hx of Pos HR HPV with colpo/and no treatment 2010        OB History     Gravida  3   Para  3   Term  0   Preterm  0   AB  0   Living         SAB  0   IAB  0   Ectopic  0   Multiple      Live Births                 Patient Active Problem List   Diagnosis Date Noted   Neck pain, chronic 05/25/2021   Chronic low back pain 05/25/2021   Status post laparoscopic hysterectomy 03/20/2021   Fibromyalgia 05/21/2008    Past Medical History:  Diagnosis Date   Chronic back pain    Dysmenorrhea    Fibroid    Fibromyalgia    Headache    History of abnormal cervical Pap smear 2010   no tx   Irregular heart rhythm    takes propranolol managed by pcp no current cardiologist   STD (sexually transmitted disease)    Hx of HPV    Past Surgical History:  Procedure Laterality Date   BREAST BIOPSY     colonscopy  2020   CYSTOSCOPY N/A 03/20/2021   Procedure: CYSTOSCOPY;  Surgeon: Nunzio Cobbs, MD;  Location: [facility];  Service: Gynecology;  Laterality: N/A;   TOTAL LAPAROSCOPIC HYSTERECTOMY WITH SALPINGECTOMY N/A 03/20/2021   Procedure: TOTAL LAPAROSCOPIC HYSTERECTOMY WITH SALPINGECTOMY WITH  VAGINAL MORCELLATION OF UTERUS;  Surgeon: Nunzio Cobbs, MD;  Location: [facility];  Service: Gynecology;  Laterality: N/A;   TUBAL LIGATION  19 yrs ago    Current Outpatient Medications  Medication Sig Dispense Refill   propranolol (INDERAL) 20 MG tablet Take 20 mg by mouth 3 (three) times daily.     No current facility-administered medications for this visit.     ALLERGIES: Patient has no known allergies.  Family History  Problem Relation Age of Onset   Diabetes Maternal Grandfather    Stroke Paternal Grandfather     Social History   Socioeconomic History   Marital status: Married    Spouse name: Not on file   Number of children: Not on file   Years of education: Not on file   Highest education level: Not on file  Occupational History   Not on file  Tobacco Use   Smoking status: Never   Smokeless tobacco: Never  Vaping Use   Vaping Use: Never used  Substance and Sexual Activity   Alcohol use: Not Currently   Drug use: Yes    Types:  Marijuana    Comment: marijuana edibles in 2020 beginning of 2021 in [place] for fibromyalgia, last one 03/15/21    Sexual activity: Yes    Birth control/protection: Surgical    Comment: Tubal`  Other Topics Concern   Not on file  Social History Narrative   ** Merged History Encounter **       Social Determinants of Health   Financial Resource Strain: Not on file  Food Insecurity: Not on file  Transportation Needs: Not on file  Physical Activity: Not on file  Stress: Not on file  Social Connections: Not on file  Intimate Partner Violence: Not on file    Review of Systems  Genitourinary:        Vaginal itching  All other systems reviewed and are negative.  PHYSICAL EXAMINATION:    BP 110/80   Pulse 87   Ht '5\' 4"'$  (1.626 m)   Wt 172 lb (78 kg)   LMP 02/18/2021 (Exact Date)   SpO2 98%   BMI 29.52 kg/m     General appearance: alert, cooperative and appears stated age  Pelvic: External  genitalia:  no lesions              Urethra:  normal appearing urethra with no masses, tenderness or lesions              Bartholins and Skenes: normal                 Vagina: normal appearing vagina with normal color and discharge, no lesions              Cervix: absent                Bimanual Exam:  Uterus:  absent              Adnexa: no mass, fullness, tenderness               Chaperone was present for exam:  Estill Bamberg, CMA  ASSESSMENT  BV.  Vaginal atrophy.   PLAN  Wet prep:  Clue cells present. No yeast.  No trichomonas. Flagyl 500 mg po bid x 7 days. Vagifem Rx.  Instructed in use.  I discussed potential effect on breast cancer. FU for annal exam and prn.  An After Visit Summary was printed and given to the patient.

## 2021-07-18 NOTE — Patient Instructions (Signed)

## 2021-07-19 DIAGNOSIS — M25512 Pain in left shoulder: Secondary | ICD-10-CM | POA: Diagnosis not present

## 2021-07-19 DIAGNOSIS — M542 Cervicalgia: Secondary | ICD-10-CM | POA: Diagnosis not present

## 2021-07-19 DIAGNOSIS — M545 Low back pain, unspecified: Secondary | ICD-10-CM | POA: Diagnosis not present

## 2021-07-19 DIAGNOSIS — M25511 Pain in right shoulder: Secondary | ICD-10-CM | POA: Diagnosis not present

## 2021-08-08 ENCOUNTER — Ambulatory Visit (INDEPENDENT_AMBULATORY_CARE_PROVIDER_SITE_OTHER): Payer: BC Managed Care – PPO | Admitting: Gastroenterology

## 2021-08-08 ENCOUNTER — Encounter: Payer: Self-pay | Admitting: Gastroenterology

## 2021-08-08 VITALS — BP 134/82 | HR 70 | Ht 64.0 in | Wt 162.0 lb

## 2021-08-08 DIAGNOSIS — K529 Noninfective gastroenteritis and colitis, unspecified: Secondary | ICD-10-CM

## 2021-08-08 DIAGNOSIS — R109 Unspecified abdominal pain: Secondary | ICD-10-CM | POA: Diagnosis not present

## 2021-08-08 NOTE — Progress Notes (Signed)
Records Request  Provider and/or Facility: Dr. Shawna Clamp  Document: Signed ROI  ROI has been faxed successfully to Dr. Shawna Clamp. Document(s) and fax confirmation(s) have been placed in the faxed file for future reference.

## 2021-08-08 NOTE — Patient Instructions (Addendum)
It was my pleasure to provide care to you today. Based on our discussion, I am providing you with my recommendations below:  RECOMMENDATION(S):   I am sending you with a trial of Xifaxan. Please take '550mg'$  by mouth 3 times daily 14 days  RECORDS:  We have asked you to sign a Release of Information today to obtain records from Dr. Shawna Clamp.   FOLLOW UP:  I would like for you to follow up with me in 1 month. Please refer to your After Visit Summary regarding your appointments.  BMI:  If you are age 4 or younger, your body mass index should be between 19-25. Your Body mass index is 27.81 kg/m. If this is out of the aformentioned range listed, please consider follow up with your Primary Care Provider.   MY CHART:  The [place] [facility] providers would like to encourage you to use [facility] to communicate with providers for non-urgent requests or questions.  Due to long hold times on the telephone, sending your provider a message by [facility] may be a faster and more efficient way to get a response.  Please allow 48 business hours for a response.  Please remember that this is for non-urgent requests.   Thank you for trusting me with your gastrointestinal care!    Thornton Park, MD, MPH

## 2021-08-08 NOTE — Progress Notes (Addendum)
Referring Provider: Girtha Rm, NP-C Primary Care Physician:  Girtha Rm, NP-C Primary Gastroenterologist:  Dr.  Luiz Iron for Consultation: Chronic diarrhea and abdominal cramping   IMPRESSION:  Chronic diarrhea Abdominal cramping with fecal urgency and loss of control Malodorous gas and faltus Diarrhea-predominant IBS Prior normal colonoscopy  Symptoms developing after recent hysterectomy.  Suspected d-IBS +/- developed of SIBO following recent surgery.  No alarm features.   PLAN: - Obtain records from Dr. Shawna Clamp at [facility] - Trial of Xifaxan 550 mg TID x 14 days - Proceed with additional evaluation as needed after reviewing outside records and her response to treatment  HPI: Brooke [name] is a 45 y.o. female referred by Dr. Quincy Simmonds for further evaluation of chronic diarrhea and abdominal cramping.  The history is obtained through the patient, review of records from Dr. Quincy Simmonds, and review of her electronic health record.  She reports a history of fibromyalgia, chronic headaches, and IBS.  She has had a total hysterectomy.  She has a history of IBS. Previously evaluated by gastroenterologist in [place], Dr. Shawna Clamp at [facility].  Had a normal colonoscopy.  Symptoms have been relatively easy to control until she had her hysterectomy in April 2022.  Had not needed medical therapy.   Developed abdominal pain and diarrhea following her hysterectomy. Cramping can be so severe they are described as worse than contractions. Associated fecal urgency and some accidents. No blood or mucous in the stool. Malordours gas and eructation. No nocturnal symptoms.    Using Imodium PRN.   Seen in the ER for dehydration. At that time, CT of the abdomen and pelvis with contrast 03/24/2021 showed changes from a laparoscopic hysterectomy.  No other abnormalities.  Stool negative for C. difficile, blood  Labs 03/2021 showed a normal CBC, normal liver enzymes, normal  lipase   Maternal grandmother with IBS- but her symptoms are not this severe.  No other family history of GI disease.   Past Medical History:  Diagnosis Date   Chronic back pain    Dysmenorrhea    Fibroid    Fibromyalgia    Headache    History of abnormal cervical Pap smear 2010   no tx   Irregular heart rhythm    takes propranolol managed by pcp no current cardiologist   STD (sexually transmitted disease)    Hx of HPV    Past Surgical History:  Procedure Laterality Date   BREAST BIOPSY     colonscopy  2020   CYSTOSCOPY N/A 03/20/2021   Procedure: CYSTOSCOPY;  Surgeon: Nunzio Cobbs, MD;  Location: [facility];  Service: Gynecology;  Laterality: N/A;   TOTAL LAPAROSCOPIC HYSTERECTOMY WITH SALPINGECTOMY N/A 03/20/2021   Procedure: TOTAL LAPAROSCOPIC HYSTERECTOMY WITH SALPINGECTOMY WITH VAGINAL MORCELLATION OF UTERUS;  Surgeon: Nunzio Cobbs, MD;  Location: [facility];  Service: Gynecology;  Laterality: N/A;   TUBAL LIGATION  19 yrs ago    Prior to Admission medications   Medication Sig Start Date End Date Taking? Authorizing Provider  estradiol (ESTRACE) 0.1 MG/GM vaginal cream Use 1/2 g vaginally every night for the first 2 weeks, then use 1/2 g vaginally two or three times per week as needed to maintain symptom relief. 07/18/21  Yes Nunzio Cobbs, MD  propranolol (INDERAL) 20 MG tablet Take 20 mg by mouth 3 (three) times daily. 09/17/20  Yes [provider]    Current Outpatient Medications  Medication Sig Dispense  Refill   estradiol (ESTRACE) 0.1 MG/GM vaginal cream Use 1/2 g vaginally every night for the first 2 weeks, then use 1/2 g vaginally two or three times per week as needed to maintain symptom relief. 42.5 g 2   propranolol (INDERAL) 20 MG tablet Take 20 mg by mouth 3 (three) times daily.     No current facility-administered medications for this visit.    Allergies as of 08/08/2021   (No  Known Allergies)    Family History  Problem Relation Age of Onset   Diabetes Maternal Grandfather    Stroke Paternal Grandfather        Review of Systems: 12 system ROS is negative except as noted above with the addition of back pain, fatigue, headaches, sleeping problems.   Physical Exam: General:   Alert,  well-nourished, pleasant and cooperative in NAD Head:  Normocephalic and atraumatic. Eyes:  Sclera clear, no icterus.   Conjunctiva pink. Ears:  Normal auditory acuity. Nose:  No deformity, discharge,  or lesions. Mouth:  No deformity or lesions.   Neck:  Supple; no masses or thyromegaly. Lungs:  Clear throughout to auscultation.   No wheezes. Heart:  Regular rate and rhythm; no murmurs. Abdomen:  Soft, I am unable to reproduce her symptoms, nondistended, normal bowel sounds, no rebound or guarding. No hepatosplenomegaly.   Rectal:  Deferred  Msk:  Symmetrical. No boney deformities LAD: No inguinal or umbilical LAD Extremities:  No clubbing or edema. Neurologic:  Alert and  oriented x4;  grossly nonfocal Skin:  Intact without significant lesions or rashes. Psych:  Alert and cooperative. Normal mood and affect.     Lastacia Solum L. Tarri Glenn, MD, MPH 08/09/2021, 1:11 PM

## 2021-08-09 ENCOUNTER — Encounter: Payer: Self-pay | Admitting: Gastroenterology

## 2021-08-09 NOTE — Progress Notes (Signed)
Records received from [facility]/Dr. Renee Ramus office and placed on Dr. Tarri Glenn desk for review.

## 2021-08-19 ENCOUNTER — Telehealth: Payer: Self-pay | Admitting: Gastroenterology

## 2021-08-19 NOTE — Telephone Encounter (Signed)
Review of records from Dr. Shawna Clamp at [facility] Last office visit 06/17/2019 for chronic diarrhea aggrevated by eating CT abd/pelvis 6/22 was remarkable only for 5.5 cm uterine fibroid Evaluation included fecal calprotectin, fecal elastase, giardia, celiac serologies, immunoglobulin panel, TSH, and colonoscopy.  Colonoscopy 07/08/19: normal. Colon and TI were biopsied. All biopsies were normal.  Labs results not included in records provided.

## 2021-08-20 NOTE — Telephone Encounter (Signed)
Appears records were received from Dr. Renee Ramus office, however, failed to include the following:  Evaluation included fecal calprotectin, fecal elastase, giardia, celiac serologies, immunoglobulin panel, TSH

## 2021-08-20 NOTE — Telephone Encounter (Signed)
Chart Review Routing History  Recipients Sent On Sent By Routed Reports   Dr. Shawna Clamp   08/20/2021  9:30 AM Aleatha Borer, LPN Telephone on [id number] with Thornton Park, MD      Cover Page Message : Records received (06/17/19 to present) but failed to include mentioned labs (fecal calprotectin, fecal elastase, giardia, celiac serologies, immunoglobulin panel and TSH). Please fax lab results to [phone].

## 2021-09-11 ENCOUNTER — Ambulatory Visit (INDEPENDENT_AMBULATORY_CARE_PROVIDER_SITE_OTHER): Payer: BC Managed Care – PPO | Admitting: Gastroenterology

## 2021-09-11 ENCOUNTER — Encounter: Payer: Self-pay | Admitting: Gastroenterology

## 2021-09-11 VITALS — BP 102/70 | HR 80 | Ht 64.0 in | Wt 159.0 lb

## 2021-09-11 DIAGNOSIS — K529 Noninfective gastroenteritis and colitis, unspecified: Secondary | ICD-10-CM

## 2021-09-11 DIAGNOSIS — R109 Unspecified abdominal pain: Secondary | ICD-10-CM | POA: Diagnosis not present

## 2021-09-11 NOTE — Progress Notes (Signed)
Referring Provider: Girtha Rm, NP-C Primary Care Physician:  Girtha Rm, NP-C  Chief complaint: Chronic diarrhea and abdominal cramping   IMPRESSION:  Chronic diarrhea Abdominal cramping with fecal urgency and loss of control Malodorous gas and faltus Diarrhea-predominant IBS Prior normal colonoscopy 2020 with normal colon biopsies  Symptoms developing after recent hysterectomy.  Given response to empiric Xifaxan, the differential remains d-IBS +/- developed of SIBO following recent surgery.  No alarm features.   PLAN: - Repeat Xifaxan 550 mg TID x 14 days - She will follow-up via MyChart with a symptom update 2 weeks after completing treatment - Consider lowFODMAP diet and probiotics if symptoms persist  HPI: Brooke [name] is a 45 y.o. female who returns in follow-up after her initial consultation for chronic diarrhea and abdominal cramping. She reports a history of fibromyalgia, chronic headaches, and IBS.  She has had a total hysterectomy.  She has a history of IBS. Previously evaluated by gastroenterologist in [place], Dr. Shawna Clamp at [facility].    Review of records from Dr. Shawna Clamp at [facility] today show: Last office visit 06/17/2019 for chronic diarrhea aggrevated by eating CT abd/pelvis 6/22 was remarkable only for 5.5 cm uterine fibroid Evaluation included fecal calprotectin, fecal elastase, giardia, celiac serologies, immunoglobulin panel, TSH, and colonoscopy.  Colonoscopy 07/08/19: normal. Colon and TI were biopsied. All biopsies were normal.  Labs results not included in records provided.   Symptoms have been relatively easy to control until she had her hysterectomy in April 2022.  Had not needed medical therapy in the past.   Developed abdominal pain and diarrhea following her hysterectomy. Cramping can be so severe they are described as worse than contractions. Associated fecal urgency and some accidents. No blood or mucous in the stool.  Malordours gas and eructation. No nocturnal symptoms. Had been using Imodium PRN.  Seen in the ER for dehydration. At that time, CT of the abdomen and pelvis with contrast 03/24/2021 showed changes from a laparoscopic hysterectomy.  No other abnormalities. Stool negative for C. difficile, blood. Labs 03/2021 showed a normal CBC, normal liver enzymes, normal lipase  Returns today after a trial of Trial of Xifaxan 550 mg TID x 14 days. Symptoms completely resolved with a normal formed bowel movement daily without abdominal cramping. However, they recurred about 2 weeks later.    Past Medical History:  Diagnosis Date   Chronic back pain    Dysmenorrhea    Fibroid    Fibromyalgia    Headache    History of abnormal cervical Pap smear 2010   no tx   Irregular heart rhythm    takes propranolol managed by pcp no current cardiologist   STD (sexually transmitted disease)    Hx of HPV    Past Surgical History:  Procedure Laterality Date   BREAST BIOPSY     colonscopy  2020   CYSTOSCOPY N/A 03/20/2021   Procedure: CYSTOSCOPY;  Surgeon: Nunzio Cobbs, MD;  Location: [facility];  Service: Gynecology;  Laterality: N/A;   TOTAL LAPAROSCOPIC HYSTERECTOMY WITH SALPINGECTOMY N/A 03/20/2021   Procedure: TOTAL LAPAROSCOPIC HYSTERECTOMY WITH SALPINGECTOMY WITH VAGINAL MORCELLATION OF UTERUS;  Surgeon: Nunzio Cobbs, MD;  Location: [facility];  Service: Gynecology;  Laterality: N/A;   TUBAL LIGATION  19 yrs ago    Prior to Admission medications   Medication Sig Start Date End Date Taking? Authorizing Provider  estradiol (ESTRACE) 0.1 MG/GM vaginal cream Use 1/2 g  vaginally every night for the first 2 weeks, then use 1/2 g vaginally two or three times per week as needed to maintain symptom relief. 07/18/21  Yes Nunzio Cobbs, MD  propranolol (INDERAL) 20 MG tablet Take 20 mg by mouth 3 (three) times daily. 09/17/20  Yes [provider]     Current Outpatient Medications  Medication Sig Dispense Refill   estradiol (ESTRACE) 0.1 MG/GM vaginal cream Use 1/2 g vaginally every night for the first 2 weeks, then use 1/2 g vaginally two or three times per week as needed to maintain symptom relief. 42.5 g 2   propranolol (INDERAL) 20 MG tablet Take 20 mg by mouth 3 (three) times daily.     No current facility-administered medications for this visit.    Allergies as of 09/11/2021   (No Known Allergies)    Family History  Problem Relation Age of Onset   Diabetes Maternal Grandfather    Stroke Paternal Grandfather     Physical Exam: General:   Alert,  well-nourished, pleasant and cooperative in NAD Head:  Normocephalic and atraumatic. Eyes:  Sclera clear, no icterus.   Conjunctiva pink. Ears:  Normal auditory acuity. Nose:  No deformity, discharge,  or lesions. Mouth:  No deformity or lesions.   Neck:  Supple; no masses or thyromegaly. Lungs:  Clear throughout to auscultation.   No wheezes. Heart:  Regular rate and rhythm; no murmurs. Abdomen:  Soft, I am unable to reproduce her symptoms, nondistended, normal bowel sounds, no rebound or guarding. No hepatosplenomegaly.   Rectal:  Deferred  Msk:  Symmetrical. No boney deformities LAD: No inguinal or umbilical LAD Extremities:  No clubbing or edema. Neurologic:  Alert and  oriented x4;  grossly nonfocal Skin:  Intact without significant lesions or rashes. Psych:  Alert and cooperative. Normal mood and affect.     Jondavid Schreier L. Tarri Glenn, MD, MPH 09/11/2021, 12:12 PM

## 2021-09-11 NOTE — Patient Instructions (Signed)
It was my pleasure to provide care to you today. Based on our discussion, I am providing you with my recommendations below:  RECOMMENDATION(S):   I am sending you with samples of Xifaxan today. Please send me a My Chart message to let me know how you are feeling  FOLLOW UP:  I would like for you to follow up with me in as needed. Please call the office at [phone] to schedule your appointment.  BMI:  If you are age 44 or younger, your body mass index should be between 19-25. Your Body mass index is 27.29 kg/m. If this is out of the aformentioned range listed, please consider follow up with your Primary Care Provider.   MY CHART:  The [place] [facility] providers would like to encourage you to use [facility] to communicate with providers for non-urgent requests or questions.  Due to long hold times on the telephone, sending your provider a message by [facility] may be a faster and more efficient way to get a response.  Please allow 48 business hours for a response.  Please remember that this is for non-urgent requests.   Thank you for trusting me with your gastrointestinal care!    Thornton Park, MD, MPH

## 2021-11-08 ENCOUNTER — Encounter: Payer: Self-pay | Admitting: Obstetrics and Gynecology

## 2021-11-09 NOTE — Telephone Encounter (Signed)
Please schedule her an appointment with Dr Quincy Simmonds early next week.

## 2022-02-28 ENCOUNTER — Encounter (HOSPITAL_COMMUNITY): Payer: Self-pay

## 2022-02-28 ENCOUNTER — Other Ambulatory Visit: Payer: Self-pay

## 2022-02-28 ENCOUNTER — Emergency Department (HOSPITAL_COMMUNITY): Payer: Self-pay

## 2022-02-28 ENCOUNTER — Emergency Department (HOSPITAL_COMMUNITY)
Admission: EM | Admit: 2022-02-28 | Discharge: 2022-02-28 | Disposition: A | Discharge status: Home / Self Care | Payer: Self-pay | Attending: Emergency Medicine | Admitting: Emergency Medicine

## 2022-02-28 DIAGNOSIS — K529 Noninfective gastroenteritis and colitis, unspecified: Secondary | ICD-10-CM | POA: Insufficient documentation

## 2022-02-28 LAB — CBC WITH DIFFERENTIAL/PLATELET
Abs Immature Granulocytes: 0.16 10*3/uL — ABNORMAL HIGH (ref 0.00–0.07)
Basophils Absolute: 0.1 10*3/uL (ref 0.0–0.1)
Basophils Relative: 0 %
Eosinophils Absolute: 0 10*3/uL (ref 0.0–0.5)
Eosinophils Relative: 0 %
HCT: 36.5 % (ref 36.0–46.0)
Hemoglobin: 12.2 g/dL (ref 12.0–15.0)
Immature Granulocytes: 1 %
Lymphocytes Relative: 7 %
Lymphs Abs: 1.3 10*3/uL (ref 0.7–4.0)
MCH: 29 pg (ref 26.0–34.0)
MCHC: 33.4 g/dL (ref 30.0–36.0)
MCV: 86.9 fL (ref 80.0–100.0)
Monocytes Absolute: 2.2 10*3/uL — ABNORMAL HIGH (ref 0.1–1.0)
Monocytes Relative: 11 %
Neutro Abs: 15.3 10*3/uL — ABNORMAL HIGH (ref 1.7–7.7)
Neutrophils Relative %: 81 %
Platelets: 372 10*3/uL (ref 150–400)
RBC: 4.2 MIL/uL (ref 3.87–5.11)
RDW: 11.9 % (ref 11.5–15.5)
WBC: 19 10*3/uL — ABNORMAL HIGH (ref 4.0–10.5)
nRBC: 0 % (ref 0.0–0.2)

## 2022-02-28 LAB — COMPREHENSIVE METABOLIC PANEL
ALT: 11 U/L (ref 0–44)
AST: 12 U/L — ABNORMAL LOW (ref 15–41)
Albumin: 3.9 g/dL (ref 3.5–5.0)
Alkaline Phosphatase: 76 U/L (ref 38–126)
Anion gap: 10 (ref 5–15)
BUN: 12 mg/dL (ref 6–20)
CO2: 23 mmol/L (ref 22–32)
Calcium: 8.8 mg/dL — ABNORMAL LOW (ref 8.9–10.3)
Chloride: 103 mmol/L (ref 98–111)
Creatinine, Ser: 0.54 mg/dL (ref 0.44–1.00)
GFR, Estimated: >60 mL/min (ref >60)
Glucose, Bld: 105 mg/dL — ABNORMAL HIGH (ref 70–99)
Potassium: 3.1 mmol/L — ABNORMAL LOW (ref 3.5–5.1)
Sodium: 136 mmol/L (ref 135–145)
Total Bilirubin: 0.1 mg/dL — ABNORMAL LOW (ref 0.3–1.2)
Total Protein: 7.2 g/dL (ref 6.5–8.1)

## 2022-02-28 LAB — LIPASE, BLOOD: Lipase: 30 U/L (ref 11–51)

## 2022-02-28 MED ORDER — SODIUM CHLORIDE 0.9 % IV BOLUS
1000.0000 mL | Freq: Once | INTRAVENOUS | Status: AC
  Administered 2022-02-28: 1000 mL via INTRAVENOUS

## 2022-02-28 MED ORDER — MORPHINE SULFATE (PF) 4 MG/ML IV SOLN
4.0000 mg | Freq: Once | INTRAVENOUS | Status: AC
  Administered 2022-02-28: 4 mg via INTRAVENOUS
  Filled 2022-02-28: qty 1

## 2022-02-28 MED ORDER — SODIUM CHLORIDE 0.9 % IV SOLN: INTRAVENOUS | Status: DC

## 2022-02-28 MED ORDER — SENNOSIDES-DOCUSATE SODIUM 8.6-50 MG PO TABS: 2.0000 | ORAL_TABLET | Freq: Every day | ORAL | 0 refills | Status: DC

## 2022-02-28 MED ORDER — SENNOSIDES-DOCUSATE SODIUM 8.6-50 MG PO TABS: 2.0000 | ORAL_TABLET | Freq: Every day | ORAL | 0 refills | Status: AC

## 2022-02-28 MED ORDER — ONDANSETRON HCL 4 MG/2ML IJ SOLN
4.0000 mg | Freq: Once | INTRAMUSCULAR | Status: AC
  Administered 2022-02-28: 4 mg via INTRAVENOUS
  Filled 2022-02-28: qty 2

## 2022-02-28 MED ORDER — METRONIDAZOLE 500 MG PO TABS: 500.0000 mg | ORAL_TABLET | Freq: Two times a day (BID) | ORAL | 0 refills | Status: DC

## 2022-02-28 MED ORDER — HYDROCODONE-ACETAMINOPHEN 5-325 MG PO TABS: 1.0000 | ORAL_TABLET | Freq: Three times a day (TID) | ORAL | 0 refills | Status: DC | PRN

## 2022-02-28 MED ORDER — METRONIDAZOLE 500 MG/100ML IV SOLN
500.0000 mg | Freq: Once | INTRAVENOUS | Status: AC
  Administered 2022-02-28: 500 mg via INTRAVENOUS
  Filled 2022-02-28: qty 100

## 2022-02-28 MED ORDER — CIPROFLOXACIN IN D5W 400 MG/200ML IV SOLN
400.0000 mg | Freq: Once | INTRAVENOUS | Status: AC
  Administered 2022-02-28: 400 mg via INTRAVENOUS
  Filled 2022-02-28: qty 200

## 2022-02-28 MED ORDER — IOHEXOL 300 MG/ML  SOLN
100.0000 mL | Freq: Once | INTRAMUSCULAR | Status: AC | PRN
  Administered 2022-02-28: 100 mL via INTRAVENOUS

## 2022-02-28 MED ORDER — ONDANSETRON 4 MG PO TBDP: 4.0000 mg | ORAL_TABLET | Freq: Three times a day (TID) | ORAL | 0 refills | Status: DC | PRN

## 2022-02-28 MED ORDER — CIPROFLOXACIN HCL 500 MG PO TABS: 500.0000 mg | ORAL_TABLET | Freq: Two times a day (BID) | ORAL | 0 refills | Status: AC

## 2022-02-28 NOTE — ED Triage Notes (Signed)
Patient with complaints of abdominal pain that moves into back with fever, and bloody diarrhea that started at 4am this morning.  ?

## 2022-02-28 NOTE — ED Provider Notes (Signed)
?[facility] ?Provider Note ? ? ?CSN: [id number] ?Arrival date & time: 02/28/22  5573 ? ?  ? ?History ? ?Chief Complaint  ?Patient presents with  ? Abdominal Pain  ? ? ?Brooke [name] is a 46 y.o. female. ? ?HPI ?Patient with a history of fibromyalgia, IBS presents with abdominal pain, nausea, vomiting, diarrhea. ?Onset was yesterday.  She notes that since that time she has not been able to take anything for relief, nor has she been eating or drinking typical amounts.  She has had multiple episodes of loose stool including the most recent episode earlier today that included both mucus-like and blood like substances. ?  ? ?Home Medications ?Prior to Admission medications   ?Medication Sig Start Date End Date Taking? Authorizing Provider  ?cyclobenzaprine (FLEXERIL) 10 MG tablet Take 10 mg by mouth 2 (two) times daily. 02/05/22  Yes [provider]  ?gabapentin (NEURONTIN) 100 MG capsule Take 100 mg by mouth 2 (two) times daily. 02/05/22  Yes [provider]  ?estradiol (ESTRACE) 0.1 MG/GM vaginal cream Use 1/2 g vaginally every night for the first 2 weeks, then use 1/2 g vaginally two or three times per week as needed to maintain symptom relief. ?Patient not taking: Reported on 02/28/2022 07/18/21   Nunzio Cobbs, MD  ?   ? ?Allergies    ?Patient has no known allergies.   ? ?Review of Systems   ?Review of Systems  ?Constitutional:   ?     Per HPI, otherwise negative  ?HENT:    ?     Per HPI, otherwise negative  ?Respiratory:    ?     Per HPI, otherwise negative  ?Cardiovascular:   ?     Per HPI, otherwise negative  ?Gastrointestinal:  Positive for abdominal pain, diarrhea, nausea and vomiting.  ?Endocrine:  ?     Negative aside from HPI  ?Genitourinary:   ?     Neg aside from HPI   ?Musculoskeletal:   ?     Per HPI, otherwise negative  ?Skin: Negative.   ?Neurological:  Negative for syncope.  ? ?Physical Exam ?Updated Vital Signs ?BP 100/66   Pulse 100   Temp 99.7 ?F  (37.6 ?C) (Oral)   Resp 18   Ht '5\' 4"'$  (1.626 m)   Wt 71.7 kg   LMP 02/18/2021 (Exact Date)   SpO2 100%   BMI 27.12 kg/m?  ?Physical Exam ?Vitals and nursing note reviewed.  ?Constitutional:   ?   General: She is not in acute distress. ?   Appearance: She is well-developed.  ?HENT:  ?   Head: Normocephalic and atraumatic.  ?Eyes:  ?   Conjunctiva/sclera: Conjunctivae normal.  ?Cardiovascular:  ?   Rate and Rhythm: Normal rate and regular rhythm.  ?Pulmonary:  ?   Effort: Pulmonary effort is normal. No respiratory distress.  ?   Breath sounds: Normal breath sounds. No stridor.  ?Abdominal:  ?   General: There is no distension.  ?   Tenderness: There is generalized abdominal tenderness.  ?Skin: ?   General: Skin is warm and dry.  ?Neurological:  ?   Mental Status: She is alert and oriented to person, place, and time.  ?   Cranial Nerves: No cranial nerve deficit.  ?Psychiatric:     ?   Mood and Affect: Mood normal.  ? ? ?ED Results / Procedures / Treatments   ?Labs ?(all labs ordered are listed, but only abnormal results are  displayed) ?Labs Reviewed  ?COMPREHENSIVE METABOLIC PANEL - Abnormal; Notable for the following components:  ?    Result Value  ? Potassium 3.1 (*)   ? Glucose, Bld 105 (*)   ? Calcium 8.8 (*)   ? AST 12 (*)   ? Total Bilirubin 0.1 (*)   ? All other components within normal limits  ?CBC WITH DIFFERENTIAL/PLATELET - Abnormal; Notable for the following components:  ? WBC 19.0 (*)   ? Neutro Abs 15.3 (*)   ? Monocytes Absolute 2.2 (*)   ? Abs Immature Granulocytes 0.16 (*)   ? All other components within normal limits  ?LIPASE, BLOOD  ?URINALYSIS, ROUTINE W REFLEX MICROSCOPIC  ? ? ?EKG ?None ? ?Radiology ?CT ABDOMEN PELVIS W CONTRAST ? ?Result Date: 02/28/2022 ?CLINICAL DATA:  Nausea, vomiting, and abdominal pain EXAM: CT ABDOMEN AND PELVIS WITH CONTRAST TECHNIQUE: Multidetector CT imaging of the abdomen and pelvis was performed using the standard protocol following bolus administration of  intravenous contrast. RADIATION DOSE REDUCTION: This exam was performed according to the departmental dose-optimization program which includes automated exposure control, adjustment of the mA and/or kV according to patient size and/or use of iterative reconstruction technique. CONTRAST:  133m OMNIPAQUE IOHEXOL 300 MG/ML  SOLN COMPARISON:  03/24/2021 FINDINGS: Lower chest:  No contributory findings. Hepatobiliary: No focal liver abnormality.No evidence of biliary obstruction or stone. Pancreas: Unremarkable. Spleen: Unremarkable. Adrenals/Urinary Tract: Negative adrenals. No hydronephrosis or stone. Unremarkable bladder. Stomach/Bowel: Distal colonic wall thickening and mucosal hyperenhancement primarily seen at the splenic flexure and below. No skip inflammation or penetrating disease seen. No appendicitis. Vascular/Lymphatic: No acute vascular abnormality. No mass or adenopathy. Reproductive:Hysterectomy.  Unremarkable adnexa Other: No ascites or pneumoperitoneum. Musculoskeletal: No acute abnormalities. IMPRESSION: Distal colitis. Electronically Signed   By: JJorje GuildM.D.   On: 02/28/2022 11:43   ? ?Procedures ?Procedures  ? ? ?Medications Ordered in ED ?Medications  ?sodium chloride 0.9 % bolus 1,000 mL (0 mLs Intravenous Stopped 02/28/22 1236)  ?  And  ?0.9 %  sodium chloride infusion ( Intravenous New Bag/Given 02/28/22 1028)  ?morphine (PF) 4 MG/ML injection 4 mg (has no administration in time range)  ?ondansetron ([facility] injection 4 mg (4 mg Intravenous Given 02/28/22 0959)  ?morphine (PF) 4 MG/ML injection 4 mg (4 mg Intravenous Given 02/28/22 1000)  ?iohexol (OMNIPAQUE) 300 MG/ML solution 100 mL (100 mLs Intravenous Contrast Given 02/28/22 1125)  ?ciprofloxacin (CIPRO) IVPB 400 mg (0 mg Intravenous Stopped 02/28/22 1406)  ?metroNIDAZOLE (FLAGYL) IVPB 500 mg (0 mg Intravenous Stopped 02/28/22 1407)  ? ? ?ED Course/ Medical Decision Making/ A&P ?This patient with a Hx of IBS, fibromyalgia presents to the ED  for concern of abdominal pain nausea, vomiting, bloody stool, this involves an extensive number of treatment options, and is a complaint that carries with it a high risk of complications and morbidity.   ? ?The differential diagnosis includes IBS flare, diverticulitis, intra-abdominal abscess, bacteremia, sepsis ? ? ?Social Determinants of Health: ? ?Chronic pain ? ?Additional history obtained: ? ?Additional history and/or information obtained from chart review, notable for GI visit in September of last year for chronic diarrhea, aggravated by eating, consistent with presumptive history of IBS. ? ? ?After the initial evaluation, orders, including: CT, labs, fluids were initiated. ? ? ?Patient placed on Cardiac and Pulse-Oximetry Monitors. ?The patient was maintained on a cardiac monitor.  The cardiac monitored showed an rhythm of sinus rhythm, rate 120 abnormal ?The patient was also maintained on pulse oximetry. The readings were  typically 99% room air normal ? ? ?On repeat evaluation of the patient improved ?Patient continued to receive fluids, IV narcotics and analgesics. ?Lab Tests: ? ?I personally interpreted labs.  The pertinent results include: Leukocytosis, elevated granulocyte count, both consistent with infection ? ?Imaging Studies ordered: ? ?I independently visualized and interpreted imaging which showed colitis ?I agree with the radiologist interpretation ? ? ?Dispostion / Final MDM: ? ?After consideration of the diagnostic results and the patient's response to treatment, she has improved, is comfortable, is hemodynamically unremarkable, is appropriate for outpatient follow-up with her gastroenterologist, which we discussed at length. ?Patient will continue antibiotics at home, received analgesics, antiemetics as well.  No evidence for bacteremia, sepsis, she is mentating appropriately, no evidence for acute other phenomena. ? ?Final Clinical Impression(s) / ED Diagnoses ?Final diagnoses:  ?Colitis   ? ? ?Rx / DC Orders ?ED Discharge Orders   ? ?      Ordered  ?  ciprofloxacin (CIPRO) 500 MG tablet  Every 12 hours       ? 02/28/22 1411  ?  metroNIDAZOLE (FLAGYL) 500 MG tablet  2 times daily       ? 02/28/22 1411  ?

## 2022-03-20 NOTE — Progress Notes (Deleted)
46 y.o. G18P0000 Married Caucasian female here for annual exam.   ? ?PCP:    ? ?Patient's last menstrual period was 02/18/2021 (exact date).     ?  ?    ?Sexually active: {yes no:314532}  ?The current method of family planning is tubal ligation/hyst.    ?Exercising: {yes no:314532}  {types:19826} ?Smoker:  {YES NO:22349} ? 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 ?MMG:  04-24-21 Neg/BiRads1 ?Colonoscopy:  07-08-19  ?BMD:   ***  Result  *** ?TDaP:  *** ?Gardasil:   {YES NO:22349} ?HIV:*** ?Hep C:*** ?Screening Labs:  Hb today: ***, Urine today: *** ? ? reports that she has never smoked. She has never used smokeless tobacco. She reports that she does not currently use alcohol. She reports that she does not currently use drugs after having used the following drugs: Marijuana. ? ?Past Medical History:  ?Diagnosis Date  ? Chronic back pain   ? Dysmenorrhea   ? Fibroid   ? Fibromyalgia   ? Headache   ? History of abnormal cervical Pap smear 2010  ? no tx  ? Irregular heart rhythm   ? takes propranolol managed by pcp no current cardiologist  ? STD (sexually transmitted disease)   ? Hx of HPV  ? ? ?Past Surgical History:  ?Procedure Laterality Date  ? BREAST BIOPSY    ? colonscopy  2020  ? CYSTOSCOPY N/A 03/20/2021  ? Procedure: CYSTOSCOPY;  Surgeon: Nunzio Cobbs, MD;  Location: [facility];  Service: Gynecology;  Laterality: N/A;  ? TOTAL LAPAROSCOPIC HYSTERECTOMY WITH SALPINGECTOMY N/A 03/20/2021  ? Procedure: TOTAL LAPAROSCOPIC HYSTERECTOMY WITH SALPINGECTOMY WITH VAGINAL MORCELLATION OF UTERUS;  Surgeon: Nunzio Cobbs, MD;  Location: [facility];  Service: Gynecology;  Laterality: N/A;  ? TUBAL LIGATION  19 yrs ago  ? ? ?Current Outpatient Medications  ?Medication Sig Dispense Refill  ? cyclobenzaprine (FLEXERIL) 10 MG tablet Take 10 mg by mouth 2 (two) times daily.    ?  estradiol (ESTRACE) 0.1 MG/GM vaginal cream Use 1/2 g vaginally every night for the first 2 weeks, then use 1/2 g vaginally two or three times per week as needed to maintain symptom relief. (Patient not taking: Reported on 02/28/2022) 42.5 g 2  ? gabapentin (NEURONTIN) 100 MG capsule Take 100 mg by mouth 2 (two) times daily.    ? HYDROcodone-acetaminophen (NORCO/VICODIN) 5-325 MG tablet Take 1 tablet by mouth 3 (three) times daily as needed for severe pain. 10 tablet 0  ? metroNIDAZOLE (FLAGYL) 500 MG tablet Take 1 tablet (500 mg total) by mouth 2 (two) times daily. 14 tablet 0  ? ondansetron (ZOFRAN-ODT) 4 MG disintegrating tablet Take 1 tablet (4 mg total) by mouth every 8 (eight) hours as needed for nausea or vomiting. 20 tablet 0  ? ?No current facility-administered medications for this visit.  ? ? ?Family History  ?Problem Relation Age of Onset  ? Diabetes Maternal Grandfather   ? Stroke Paternal Grandfather   ? ? ?Review of Systems ? ?Exam:   ?LMP 02/18/2021 (Exact Date)     ?General appearance: alert, cooperative and appears stated age ?Head: normocephalic, without obvious abnormality, atraumatic ?Neck: no adenopathy, supple, symmetrical, trachea midline and thyroid normal to inspection and palpation ?Lungs: clear to auscultation bilaterally ?Breasts: normal appearance, no masses or tenderness, No nipple retraction or dimpling, No nipple discharge  or bleeding, No axillary adenopathy ?Heart: regular rate and rhythm ?Abdomen: soft, non-tender; no masses, no organomegaly ?Extremities: extremities normal, atraumatic, no cyanosis or edema ?Skin: skin color, texture, turgor normal. No rashes or lesions ?Lymph nodes: cervical, supraclavicular, and axillary nodes normal. ?Neurologic: grossly normal ? ?Pelvic: External genitalia:  no lesions ?             No abnormal inguinal nodes palpated. ?             Urethra:  normal appearing urethra with no masses, tenderness or lesions ?             Bartholins and Skenes:  normal    ?             Vagina: normal appearing vagina with normal color and discharge, no lesions ?             Cervix: no lesions ?             Pap taken: {yes no:314532} ?Bimanual Exam:  Uterus:  normal size, contour, position, consistency, mobility, non-tender ?             Adnexa: no mass, fullness, tenderness ?             Rectal exam: {yes no:314532}.  Confirms. ?             Anus:  normal sphincter tone, no lesions ? ?Chaperone was present for exam:  *** ? ?Assessment:   ?Well woman visit with gynecologic exam. ? ? ?Plan: ?Mammogram screening discussed. ?Self breast awareness reviewed. ?Pap and HR HPV as above. ?Guidelines for Calcium, Vitamin D, regular exercise program including cardiovascular and weight bearing exercise. ?  ?Follow up annually and prn.  ? ?Additional counseling given.  {yes [id number]. ?_______ minutes face to face time of which over 50% was spent in counseling.  ? ? ?After visit summary provided.  ? ? ? ?

## 2022-03-21 ENCOUNTER — Ambulatory Visit: Payer: BC Managed Care – PPO | Admitting: Obstetrics and Gynecology

## 2022-03-21 DIAGNOSIS — Z0289 Encounter for other administrative examinations: Secondary | ICD-10-CM

## 2022-05-03 IMAGING — CT CT ABD-PELV W/ CM
2 of 6 series · 16 of 46 positions shown, 18 images · IV contrast (omnipaque)
Comparison: None.

CLINICAL DATA: History of laparoscopic hysterectomy 4 days ago with
new onset abdominal pain and diarrhea

EXAM:
CT ABDOMEN AND PELVIS WITH CONTRAST
TECHNIQUE: Multidetector CT imaging of the abdomen and pelvis was performed
using the standard protocol following bolus administration of
intravenous contrast.
CONTRAST:  100mL OMNIPAQUE IOHEXOL 300 MG/ML  SOLN

[Series 2: axial st · axial · 0.86mm/px · z∈[-458,-64]mm · 13 of 93 slices shown, 15 images]
[im 7/93  soft-tissue]
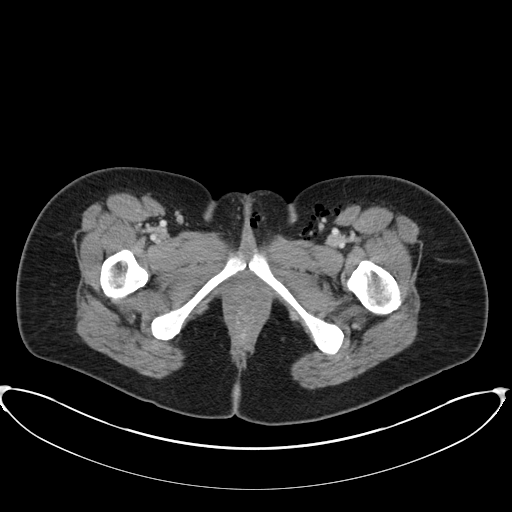
[im 7/93  bone]
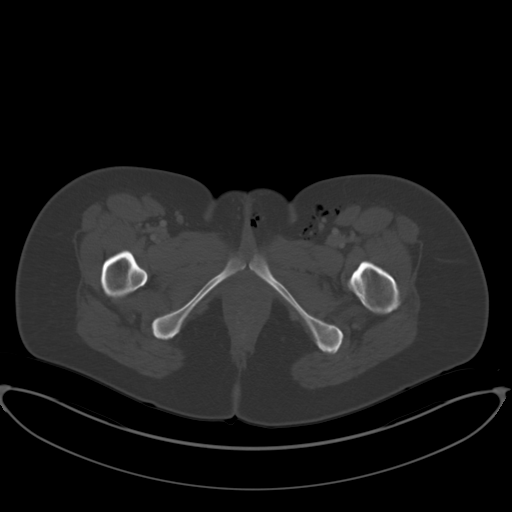
[im 14/93  soft-tissue]
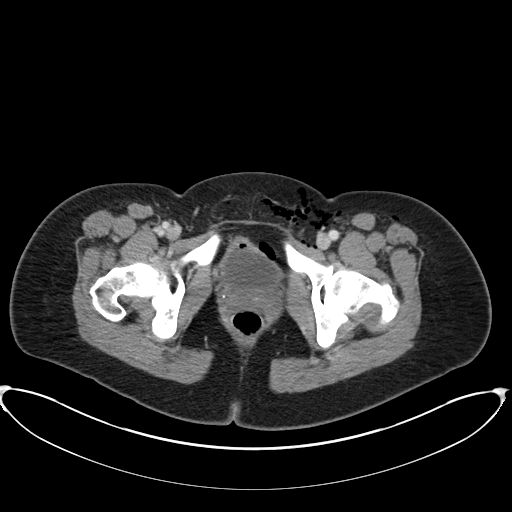
[im 20/93  soft-tissue]
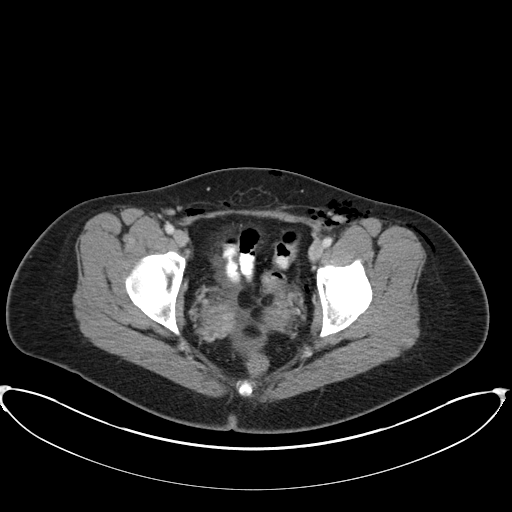
[im 27/93  soft-tissue]
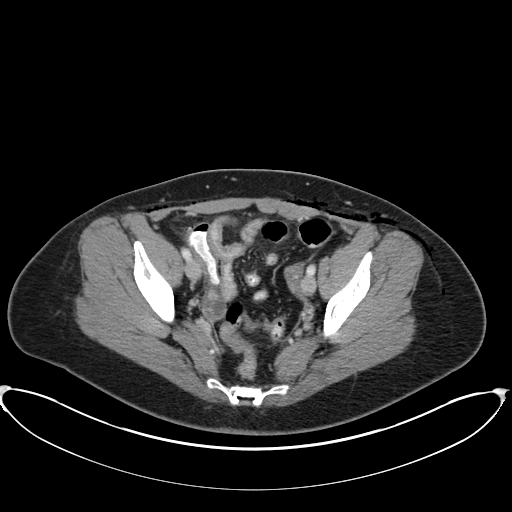
[im 33/93  soft-tissue]
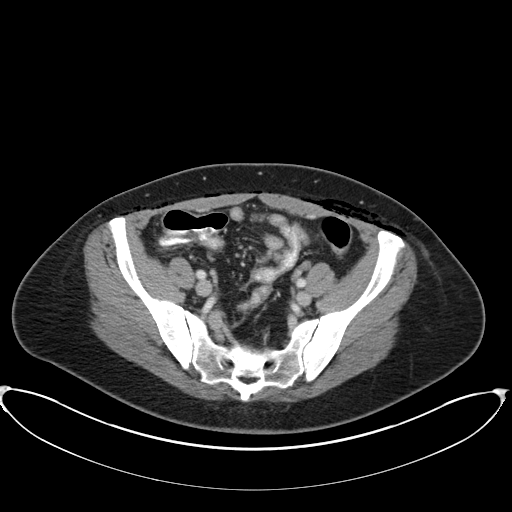
[im 40/93  soft-tissue]
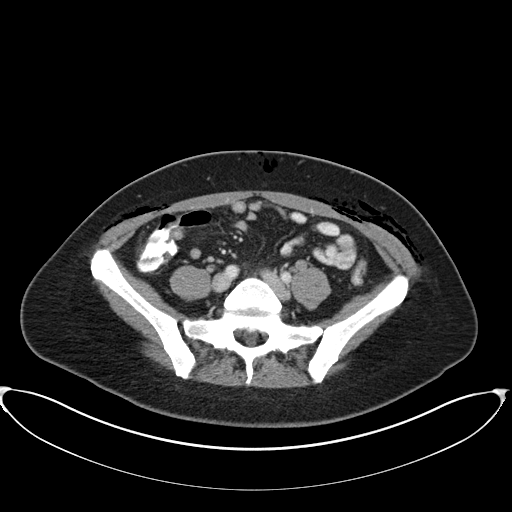
[im 47/93  soft-tissue]
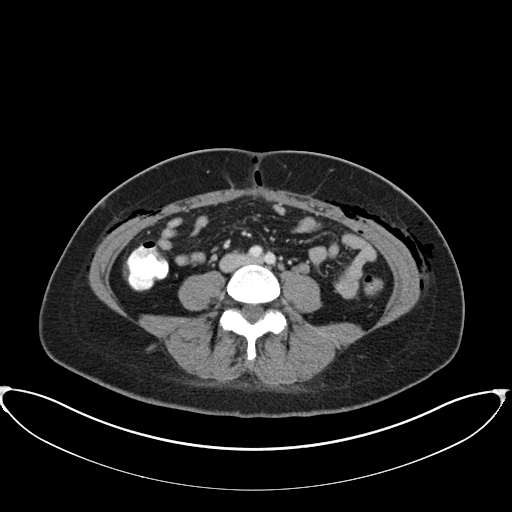
[im 53/93  soft-tissue]
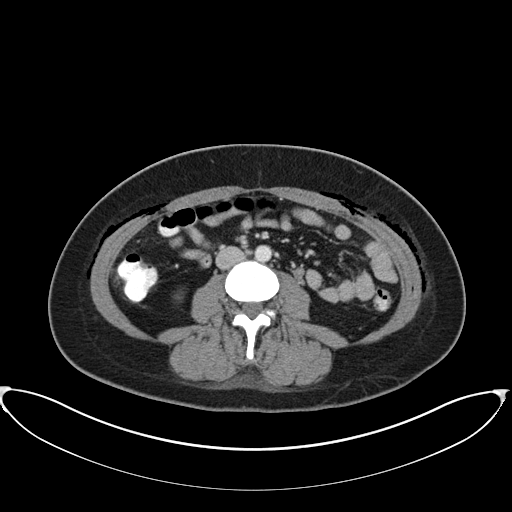
[im 60/93  soft-tissue]
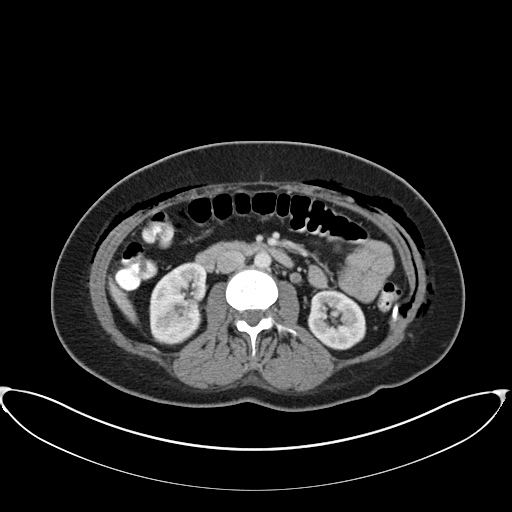
[im 60/93  bone]
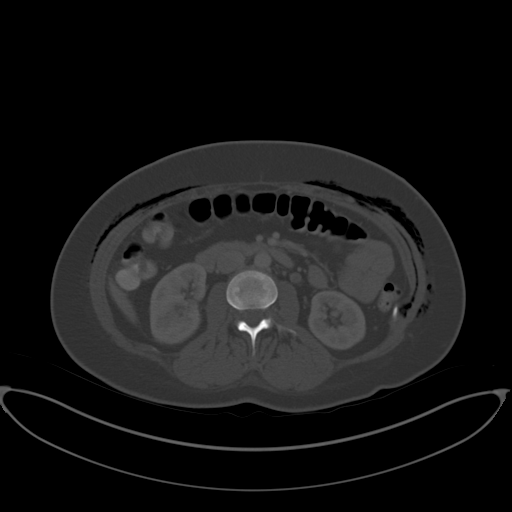
[im 66/93  soft-tissue]
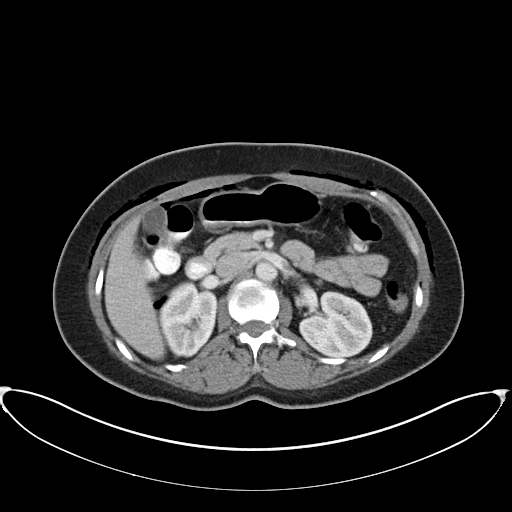
[im 73/93  soft-tissue]
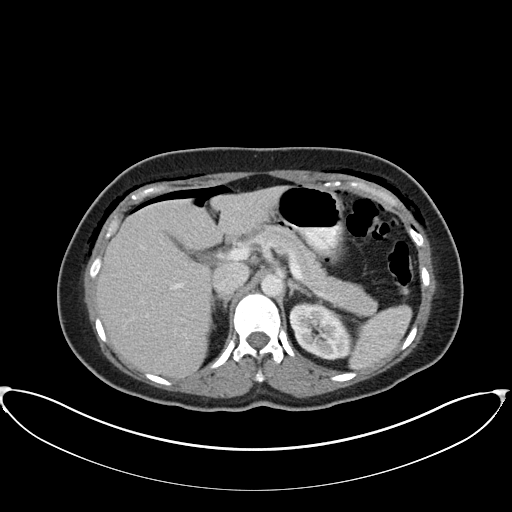
[im 79/93  soft-tissue]
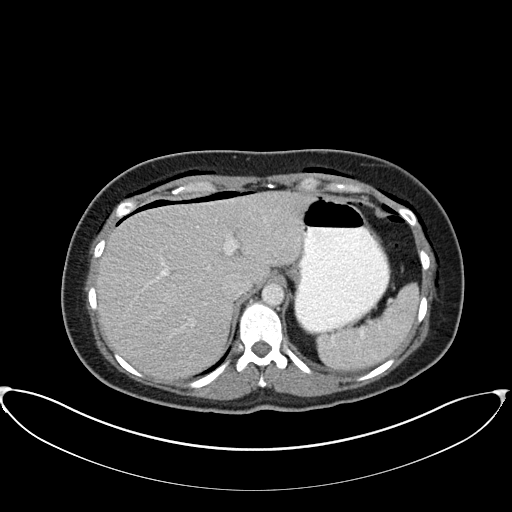
[im 86/93  soft-tissue]
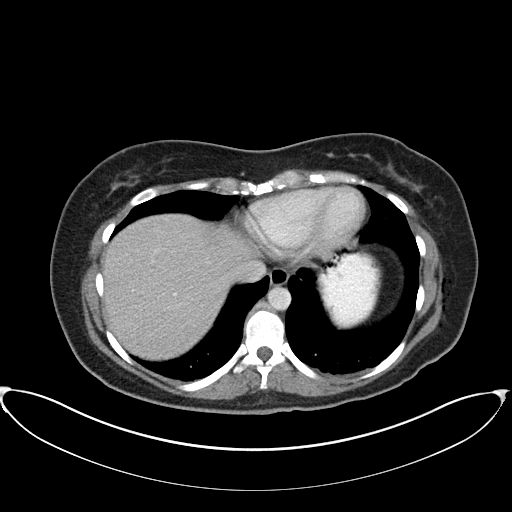

[Series 4: coronal st · coronal · 0.78mm/px · 3 of 131 slices shown]
[im 44/131  soft-tissue]
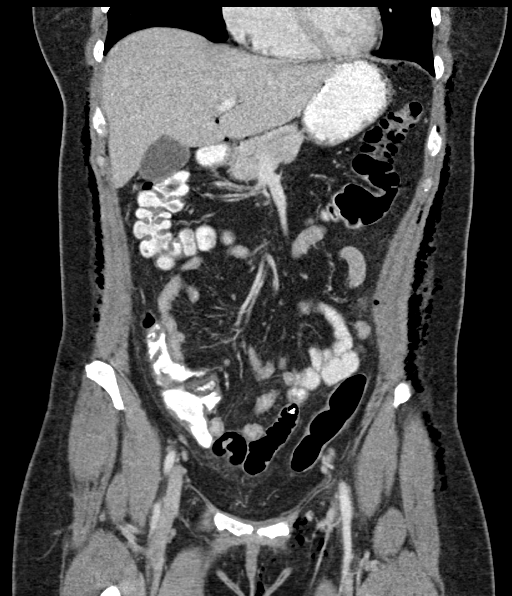
[im 58/131  soft-tissue]
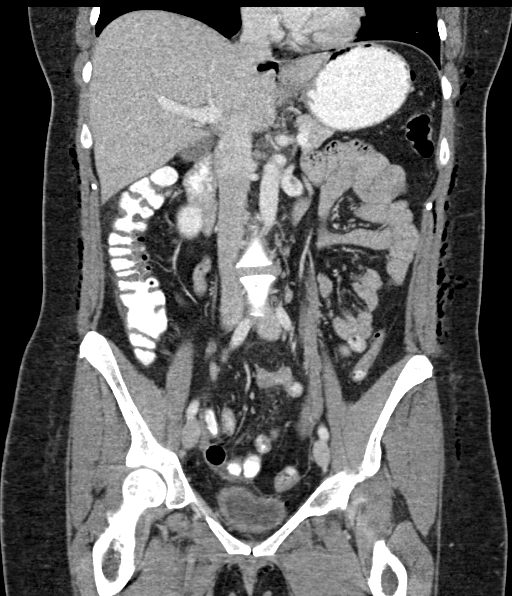
[im 73/131  soft-tissue]
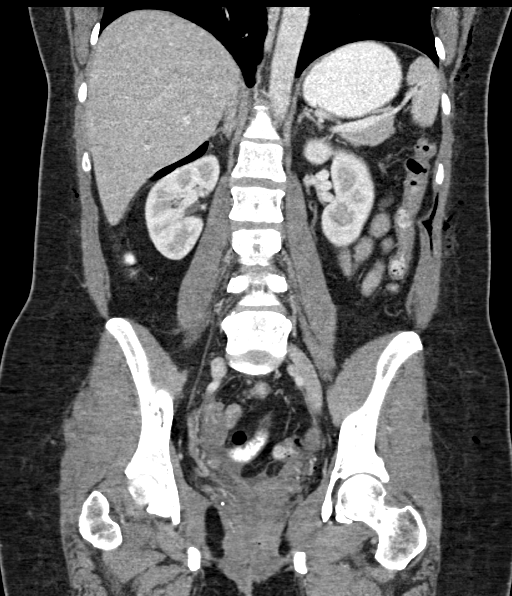

[16 of 46 positions shown; findings below may reference images not displayed]

FINDINGS: Lower chest: No acute abnormality.

Hepatobiliary: No focal liver abnormality is seen. No gallstones,
gallbladder wall thickening, or biliary dilatation.

Pancreas: Unremarkable. No pancreatic ductal dilatation or
surrounding inflammatory changes.

Spleen: Normal in size without focal abnormality.

Adrenals/Urinary Tract: Adrenal glands are within normal limits.
Kidneys demonstrate a normal enhancement pattern bilaterally. No
renal calculi or urinary tract obstructive changes are seen. Normal
excretion is noted on delayed images. At least partial duplication
on the left is noted. The bladder is decompressed.

Stomach/Bowel: No obstructive or inflammatory changes of the colon
are seen. The appendix is within normal limits. Small bowel and
stomach are unremarkable.

Vascular/Lymphatic: No significant vascular findings are present. No
enlarged abdominal or pelvic lymph nodes.

Reproductive: Status post hysterectomy. No adnexal masses. Free air
is noted within the abdomen as well as within the subcutaneous soft
tissues related to the recent laparoscopic procedure. This is felt
to be expected. No focal abscess is seen.

Other: Minimal free fluid is noted within the pelvis also likely
related to the recent surgery.

Musculoskeletal: No acute or significant osseous findings.
IMPRESSION: Changes consistent with recent laparoscopic hysterectomy with mild
free air and free fluid within the abdomen as well as free air in
the subcutaneous tissues related to the recent procedure. No abscess
or postoperative hematoma is seen.

No other focal abnormality is noted.

## 2022-10-23 ENCOUNTER — Encounter: Payer: Self-pay | Admitting: Internal Medicine

## 2022-12-30 ENCOUNTER — Other Ambulatory Visit: Payer: Self-pay

## 2022-12-30 ENCOUNTER — Emergency Department (HOSPITAL_COMMUNITY)
Admission: EM | Admit: 2022-12-30 | Discharge: 2022-12-30 | Disposition: A | Discharge status: Home / Self Care | Payer: Self-pay | Attending: Emergency Medicine | Admitting: Emergency Medicine

## 2022-12-30 ENCOUNTER — Encounter (HOSPITAL_COMMUNITY): Payer: Self-pay | Admitting: Emergency Medicine

## 2022-12-30 DIAGNOSIS — L2082 Flexural eczema: Secondary | ICD-10-CM | POA: Insufficient documentation

## 2022-12-30 MED ORDER — PREDNISONE 50 MG PO TABS
60.0000 mg | ORAL_TABLET | Freq: Once | ORAL | Status: AC
  Administered 2022-12-30: 60 mg via ORAL
  Filled 2022-12-30: qty 1

## 2022-12-30 MED ORDER — PREDNISONE 10 MG (21) PO TBPK: ORAL_TABLET | ORAL | 0 refills | Status: DC

## 2022-12-30 NOTE — ED Triage Notes (Signed)
Pt c/o rash since Thursday that has gotten progressively worse and headache that started today.

## 2022-12-30 NOTE — ED Provider Notes (Signed)
   [facility]  Provider Note  CSN: [id number] Arrival date & time: 12/30/22 1952  History Chief Complaint  Patient presents with   Rash    Brooke [name] is a 47 y.o. female with no significant PMH reports 3-4 days of itchy rash started on her arms in antecubital fossa, now also behind her knees and on her buttocks. No new exposures she is aware of. No significant improvement with benadryl. Not on face, no fever or sore throat.    Home Medications Prior to Admission medications   Medication Sig Start Date End Date Taking? Authorizing Provider  predniSONE (STERAPRED UNI-PAK 21 TAB) 10 MG (21) TBPK tablet '10mg'$  Tabs, 6 day taper. Use as directed 12/30/22  Yes Truddie Hidden, MD  estradiol (ESTRACE) 0.1 MG/GM vaginal cream Use 1/2 g vaginally every night for the first 2 weeks, then use 1/2 g vaginally two or three times per week as needed to maintain symptom relief. Patient not taking: Reported on 02/28/2022 07/18/21   Nunzio Cobbs, MD  gabapentin (NEURONTIN) 100 MG capsule Take 100 mg by mouth 2 (two) times daily. 02/05/22   [provider]     Allergies    Patient has no known allergies.   Review of Systems   Review of Systems Please see HPI for pertinent positives and negatives  Physical Exam BP 129/89 (BP Location: Right Arm)   Pulse 86   Temp 98.4 F (36.9 C) (Oral)   Resp 16   Ht '5\' 4"'$  (1.626 m)   Wt 65.8 kg   LMP 02/18/2021 (Exact Date)   SpO2 98%   BMI 24.89 kg/m   Physical Exam Vitals and nursing note reviewed.  HENT:     Head: Normocephalic.     Nose: Nose normal.  Eyes:     Extraocular Movements: Extraocular movements intact.  Pulmonary:     Effort: Pulmonary effort is normal.  Musculoskeletal:        General: Normal range of motion.     Cervical back: Neck supple.  Skin:    Findings: No rash (eczematous rash to BUE and L posterior leg).  Neurological:     Mental Status: She is alert  and oriented to person, place, and time.  Psychiatric:        Mood and Affect: Mood normal.     ED Results / Procedures / Treatments   EKG None  Procedures Procedures  Medications Ordered in the ED Medications  predniSONE (DELTASONE) tablet 60 mg (has no administration in time range)    Initial Impression and Plan  Patient with a nonspecific, but eczematous appearing rash. No signs of airway or facial involvement. Will Rx prednisone, continue benadryl. PCP and/or Derm follow up if not improving.   ED Course       MDM Rules/Calculators/A&P Medical Decision Making Problems Addressed: Flexural eczema: acute illness or injury  Risk Prescription drug management.     Final Clinical Impression(s) / ED Diagnoses Final diagnoses:  Flexural eczema    Rx / DC Orders ED Discharge Orders          Ordered    predniSONE (STERAPRED UNI-PAK 21 TAB) 10 MG (21) TBPK tablet        12/30/22 2312             Truddie Hidden, MD 12/30/22 2312

## 2023-03-06 ENCOUNTER — Ambulatory Visit
Admission: EM | Admit: 2023-03-06 | Discharge: 2023-03-06 | Disposition: A | Discharge status: Home / Self Care | Payer: Self-pay

## 2023-03-06 DIAGNOSIS — R300 Dysuria: Secondary | ICD-10-CM | POA: Insufficient documentation

## 2023-03-06 LAB — POCT URINALYSIS DIP (MANUAL ENTRY)
Bilirubin, UA: NEGATIVE
Glucose, UA: NEGATIVE mg/dL
Ketones, POC UA: NEGATIVE mg/dL
Leukocytes, UA: NEGATIVE
Nitrite, UA: NEGATIVE
Protein Ur, POC: NEGATIVE mg/dL
Spec Grav, UA: >=1.03 — AB (ref 1.010–1.025)
Urobilinogen, UA: 0.2 E.U./dL
pH, UA: 5.5 (ref 5.0–8.0)

## 2023-03-06 MED ORDER — PHENAZOPYRIDINE HCL 100 MG PO TABS: 100.0000 mg | ORAL_TABLET | Freq: Three times a day (TID) | ORAL | 0 refills | Status: AC | PRN

## 2023-03-06 NOTE — ED Triage Notes (Signed)
Pt reports on and off burning when urinating x 1 week.

## 2023-03-06 NOTE — ED Provider Notes (Signed)
[facility]    CSN: [id number] Arrival date & time: 03/06/23  1413      History   Chief Complaint No chief complaint on file.   HPI Brooke [name] is a 47 y.o. female.   Patient presents today for 1 week history of occasional burning with urination and urinary frequency.  She denies urinary urgency, voiding smaller amounts, new urinary incontinence, foul urinary odor, hematuria, new abdominal or back pain, suprapubic pain/pressure, flank pain, fever, nausea/vomiting, and vaginal discharge.  No concern for STI today.  Has not taken anything for symptoms so far.  Denies history of recurrent UTI.    Past Medical History:  Diagnosis Date   Chronic back pain    Dysmenorrhea    Fibroid    Fibromyalgia    Headache    History of abnormal cervical Pap smear 2010   no tx   Irregular heart rhythm    takes propranolol managed by pcp no current cardiologist   STD (sexually transmitted disease)    Hx of HPV    Patient Active Problem List   Diagnosis Date Noted   Neck pain, chronic 05/25/2021   Chronic low back pain 05/25/2021   Status post laparoscopic hysterectomy 03/20/2021   Fibromyalgia 05/21/2008    Past Surgical History:  Procedure Laterality Date   BREAST BIOPSY     colonscopy  2020   CYSTOSCOPY N/A 03/20/2021   Procedure: CYSTOSCOPY;  Surgeon: Nunzio Cobbs, MD;  Location[facility];  Service: Gynecology;  Laterality: N/A;   TOTAL LAPAROSCOPIC HYSTERECTOMY WITH SALPINGECTOMY N/A 03/20/2021   Procedure: TOTAL LAPAROSCOPIC HYSTERECTOMY WITH SALPINGECTOMY WITH VAGINAL MORCELLATION OF UTERUS;  Surgeon: Nunzio Cobbs, MD;  Location: [facility];  Service: Gynecology;  Laterality: N/A;   TUBAL LIGATION  19 yrs ago    OB History     Gravida  3   Para  3   Term  0   Preterm  0   AB  0   Living         SAB  0   IAB  0   Ectopic  0   Multiple      Live Births               Home  Medications    Prior to Admission medications   Medication Sig Start Date End Date Taking? Authorizing Provider  cyclobenzaprine (FLEXERIL) 10 MG tablet Take 10 mg by mouth 3 (three) times daily as needed for muscle spasms.   Yes [provider]  phenazopyridine (PYRIDIUM) 100 MG tablet Take 1 tablet (100 mg total) by mouth 3 (three) times daily as needed for pain. 03/06/23  Yes [name], NP  estradiol (ESTRACE) 0.1 MG/GM vaginal cream Use 1/2 g vaginally every night for the first 2 weeks, then use 1/2 g vaginally two or three times per week as needed to maintain symptom relief. Patient not taking: Reported on 02/28/2022 07/18/21   Nunzio Cobbs, MD  gabapentin (NEURONTIN) 100 MG capsule Take 100 mg by mouth 2 (two) times daily. 02/05/22   [provider]  propranolol (INDERAL) 60 MG tablet Take 60 mg by mouth daily as needed.    [provider]    Family History Family History  Problem Relation Age of Onset   Diabetes Maternal Grandfather    Stroke Paternal Grandfather     Social History Social History   Tobacco Use  Smoking status: Never   Smokeless tobacco: Never  Vaping Use   Vaping Use: Never used  Substance Use Topics   Alcohol use: Not Currently   Drug use: Yes    Types: Marijuana    Comment: marijuana edibles in 2020 beginning of 2021 in [place] for fibromyalgia, last one 03/15/21      Allergies   Patient has no known allergies.   Review of Systems Review of Systems Per HPI  Physical Exam Triage Vital Signs ED Triage Vitals  Enc Vitals Group     BP 03/06/23 1415 122/82     Pulse Rate 03/06/23 1415 85     Resp 03/06/23 1415 16     Temp 03/06/23 1415 97.8 F (36.6 C)     Temp Source 03/06/23 1415 Oral     SpO2 03/06/23 1415 98 %     Weight --      Height --      Head Circumference --      Peak Flow --      Pain Score 03/06/23 1416 0     Pain Loc --      Pain Edu? --      Excl. in [place]? --    No data  found.  Updated Vital Signs BP 122/82 (BP Location: Right Arm)   Pulse 85   Temp 97.8 F (36.6 C) (Oral)   Resp 16   LMP 02/18/2021 (Exact Date)   SpO2 98%   Visual Acuity Right Eye Distance:   Left Eye Distance:   Bilateral Distance:    Right Eye Near:   Left Eye Near:    Bilateral Near:     Physical Exam Vitals and nursing note reviewed.  Constitutional:      General: She is not in acute distress.    Appearance: She is not toxic-appearing.  Pulmonary:     Effort: Pulmonary effort is normal. No respiratory distress.  Abdominal:     General: Abdomen is flat. Bowel sounds are normal. There is no distension.     Palpations: Abdomen is soft. There is no mass.     Tenderness: There is abdominal tenderness ("pressure") in the suprapubic area. There is no right CVA tenderness, left CVA tenderness or guarding.  Skin:    General: Skin is warm and dry.     Coloration: Skin is not jaundiced or pale.     Findings: No erythema.  Neurological:     Mental Status: She is alert and oriented to person, place, and time.     Motor: No weakness.     Gait: Gait normal.  Psychiatric:        Behavior: Behavior is cooperative.      UC Treatments / Results  Labs (all labs ordered are listed, but only abnormal results are displayed) Labs Reviewed  POCT URINALYSIS DIP (MANUAL ENTRY) - Abnormal; Notable for the following components:      Result Value   Spec Grav, UA >=1.030 (*)    Blood, UA moderate (*)    All other components within normal limits  URINE CULTURE    EKG   Radiology No results found.  Procedures Procedures (including critical care time)  Medications Ordered in UC Medications - No data to display  Initial Impression / Assessment and Plan / UC Course  I have reviewed the triage vital signs and the nursing notes.  Pertinent labs & imaging results that were available during my care of the patient were reviewed by me and considered  in my medical decision making  (see chart for details).   Patient is well-appearing, normotensive, afebrile, not tachycardic, not tachypneic, oxygenating well on room air.    1. Dysuria Vital signs and examination today are reassuring Symptoms are not consistent with classic UTI Urinalysis today shows elevated specific gravity and moderate blood; no white blood cells or nitrites Differential includes acute cystitis versus possible nephrolithiasis Will send urine for culture, treat as indicated if this comes back positive In meantime, start Pyridium, increase water intake, push fluids Strict ER precautions discussed with patient  The patient was given the opportunity to ask questions.  All questions answered to their satisfaction.  The patient is in agreement to this plan.    Final Clinical Impressions(s) / UC Diagnoses   Final diagnoses:  Dysuria     Discharge Instructions      The urine sample today shows a little bit of blood and that you may be a little dehydrated-please increase water intake to at least 64 ounces daily We are sending the urine for a culture and will call you if this shows a urinary tract infection In the meantime, start taking the Pyridium to help with the bladder pain-she can take this every 8 hours as needed If your symptoms worsen and you develop severe back pain, nausea/vomiting and are unable to keep fluids down, please go to emergency room Follow-up with PCP or OB/GYN if the urine culture is negative and your symptoms persist    ED Prescriptions     Medication Sig Dispense Auth. Provider   phenazopyridine (PYRIDIUM) 100 MG tablet Take 1 tablet (100 mg total) by mouth 3 (three) times daily as needed for pain. 12 tablet [name], NP      PDMP not reviewed this encounter.   [name], NP 03/06/23 1447

## 2023-03-06 NOTE — Discharge Instructions (Signed)
The urine sample today shows a little bit of blood and that you may be a little dehydrated-please increase water intake to at least 64 ounces daily We are sending the urine for a culture and will call you if this shows a urinary tract infection In the meantime, start taking the Pyridium to help with the bladder pain-she can take this every 8 hours as needed If your symptoms worsen and you develop severe back pain, nausea/vomiting and are unable to keep fluids down, please go to emergency room Follow-up with PCP or OB/GYN if the urine culture is negative and your symptoms persist

## 2023-03-07 LAB — URINE CULTURE: Culture: <10000 — AB

## 2023-08-19 ENCOUNTER — Other Ambulatory Visit: Payer: Self-pay

## 2023-08-19 ENCOUNTER — Encounter: Payer: Self-pay | Admitting: Allergy and Immunology

## 2023-08-19 ENCOUNTER — Ambulatory Visit (INDEPENDENT_AMBULATORY_CARE_PROVIDER_SITE_OTHER): Payer: Self-pay | Admitting: Allergy and Immunology

## 2023-08-19 VITALS — BP 116/76 | HR 92 | Temp 98.3°F | Resp 18 | Ht 64.0 in | Wt 146.4 lb

## 2023-08-19 DIAGNOSIS — T783XXD Angioneurotic edema, subsequent encounter: Secondary | ICD-10-CM

## 2023-08-19 DIAGNOSIS — T781XXD Other adverse food reactions, not elsewhere classified, subsequent encounter: Secondary | ICD-10-CM

## 2023-08-19 DIAGNOSIS — L500 Allergic urticaria: Secondary | ICD-10-CM

## 2023-08-19 MED ORDER — FAMOTIDINE 20 MG PO TABS: 20.0000 mg | ORAL_TABLET | Freq: Two times a day (BID) | ORAL | 1 refills | Status: DC

## 2023-08-19 MED ORDER — CETIRIZINE HCL 10 MG PO TABS: 20.0000 mg | ORAL_TABLET | Freq: Two times a day (BID) | ORAL | 1 refills | Status: DC

## 2023-08-19 MED ORDER — EPINEPHRINE 0.3 MG/0.3ML IJ SOAJ: 0.3000 mg | INTRAMUSCULAR | 1 refills | Status: DC | PRN

## 2023-08-19 NOTE — Patient Instructions (Addendum)
  1.  Allergen avoidance measures - peanuts, pollens  2.  Can use the following every day:   A. Cetirizine 10 mg - 1-2 tablets 1-2 times per day (MAX=40/day)  B. Famotidine 20 mg -  1 tablet 1-2 times per day   3. Review blood tests from [facility]. Further testing???  4. Epi-pen, benadryl, MD/ER evaluation for allergic reaction  5. Return to clinic in 4 weeks or earlier if problem  6. Plan for fall flu vaccine

## 2023-08-19 NOTE — Progress Notes (Unsigned)
[place] - [place] - [place] - [place] - [place]   Dear Katrinka Blazing,  Thank you for referring Kathlene Cote to the [facility] [place] on 08/19/2023.   Below is a summation of this patient's evaluation and recommendations.  Thank you for your referral. I will keep you informed about this patient's response to treatment.   If you have any questions please do not hesitate to contact me.   Sincerely,  Jessica Priest, MD Allergy / Immunology [facility] [place]   ______________________________________________________________________    NEW PATIENT NOTE  Referring Provider: Merri Brunette, MD Primary Provider: Merri Brunette, MD Date of office visit: 08/19/2023    Subjective:   Chief Complaint:  Brooke [name] (DOB: June 06, 1976) is a 47 y.o. female who presents to the clinic on 08/19/2023 with a chief complaint of Allergy Testing (Environmental: ALL/Food: Peanut; [place]; Shellfish/fish??? - Salmon - Fine) .     HPI: Marshea presents to this clinic in evaluation of recurrent reactions.  Since January 2024 she has had these episodes where she feels as though her entire body swells up especially her legs and arms and torso and then she will develop these flat red blotchy itchy areas across her body and may be some "heavy breathing" but no other associated systemic or constitutional symptoms.  These episodes can last up to 3 days but they never heal with scar or hyperpigmentation.  She has attempted to work through the trigger giving rise to this issue.  She initially thought that this was secondary to the use of nonsteroidal anti-inflammatory drugs and she has been without those agents since April and she initially thought that this was secondary to nut consumption and she has been without nut consumption since March.  But she still continues to have these episodes about every 3 days or so and again they last  for about 2 days or so.  She has not really started any new medications that may account for this issue in 2023.  She has not really had a significant change in her environment in 2023.  She has apparently been evaluated for autoimmune disease in July 2024 for a history of fibromyalgia.  She does have a history of IBS but if she takes her probiotic consistently she really has very little problems with this problem.  Past Medical History:  Diagnosis Date  . Chronic back pain   . Dysmenorrhea   . Fibroid   . Fibromyalgia   . Headache   . History of abnormal cervical Pap smear 2010   no tx  . Irregular heart rhythm    takes propranolol managed by pcp no current cardiologist  . STD (sexually transmitted disease)    Hx of HPV    Past Surgical History:  Procedure Laterality Date  . BREAST BIOPSY    . colonscopy  2020  . CYSTOSCOPY N/A 03/20/2021   Procedure: CYSTOSCOPY;  Surgeon: Patton Salles, MD;  Location[facility];  Service: Gynecology;  Laterality: N/A;  . TOTAL LAPAROSCOPIC HYSTERECTOMY WITH SALPINGECTOMY N/A 03/20/2021   Procedure: TOTAL LAPAROSCOPIC HYSTERECTOMY WITH SALPINGECTOMY WITH VAGINAL MORCELLATION OF UTERUS;  Surgeon: Patton Salles, MD;  Location[facility];  Service: Gynecology;  Laterality: N/A;  . TUBAL LIGATION  19 yrs ago    Allergies as of 08/19/2023       Reactions   Peanut Oil Hives   Nsaids  Anxiety, Hives, Itching, Palpitations, Rash, Swelling   Tree Extract Anxiety, Hives, Itching, Rash, Swelling        Medication List    ALPRAZolam 0.25 MG tablet Commonly known as: XANAX Take 0.25 mg by mouth as needed for anxiety.   cyclobenzaprine 10 MG tablet Commonly known as: FLEXERIL Take 10 mg by mouth 3 (three) times daily as needed for muscle spasms.   gabapentin 100 MG capsule Commonly known as: NEURONTIN Take 100 mg by mouth 2 (two) times daily.   phenazopyridine 100 MG tablet Commonly known  as: Pyridium Take 1 tablet (100 mg total) by mouth 3 (three) times daily as needed for pain.   Probiotic 1-250 BILLION-MG Caps Take 1 capsule by mouth daily.   propranolol 60 MG tablet Commonly known as: INDERAL Take 60 mg by mouth daily as needed.   Vitamin D 125 MCG (5000 UT) Caps Take 1 capsule by mouth daily.    Review of systems negative except as noted in HPI / PMHx or noted below:  Review of Systems  Constitutional: Negative.   HENT: Negative.    Eyes: Negative.   Respiratory: Negative.    Cardiovascular: Negative.   Gastrointestinal: Negative.   Genitourinary: Negative.   Musculoskeletal: Negative.   Skin: Negative.   Neurological: Negative.   Endo/Heme/Allergies: Negative.   Psychiatric/Behavioral: Negative.      Family History  Problem Relation Age of Onset  . Diabetes Maternal Grandfather   . Stroke Paternal Grandfather     Social History   Socioeconomic History  . Marital status: Married    Spouse name: Not on file  . Number of children: Not on file  . Years of education: Not on file  . Highest education level: Not on file  Occupational History  . Not on file  Tobacco Use  . Smoking status: Never    Passive exposure: Never  . Smokeless tobacco: Never  Vaping Use  . Vaping status: Never Used  Substance and Sexual Activity  . Alcohol use: Not Currently  . Drug use: Yes    Types: Marijuana    Comment: marijuana edibles in 2020 beginning of 2021 in [place] for fibromyalgia, last one 03/15/21   . Sexual activity: Yes    Birth control/protection: Surgical    Comment: Tubal`  Other Topics Concern  . Not on file  Social History Narrative   ** Merged History Encounter **       Environmental and Social history  Lives in a house with a dry environment, a cat and dog located inside the household, no carpet in the bedroom, plastic on the bed, no plastic on the pillow, and no smoking ongoing with inside the household.  Objective:   Vitals:    08/19/23 0927  BP: 116/76  Pulse: 92  Resp: 18  Temp: 98.3 F (36.8 C)  SpO2: 98%   Height: 5\' 4"  (162.6 cm) Weight: 146 lb 6.4 oz (66.4 kg)  Physical Exam Constitutional:      Appearance: She is not diaphoretic.  HENT:     Head: Normocephalic.     Right Ear: Tympanic membrane, ear canal and external ear normal.     Left Ear: Tympanic membrane, ear canal and external ear normal.     Nose: Nose normal. No mucosal edema or rhinorrhea.     Mouth/Throat:     Pharynx: Uvula midline. No oropharyngeal exudate.  Eyes:     Conjunctiva/sclera: Conjunctivae normal.  Neck:     Thyroid: No thyromegaly.  Trachea: Trachea normal. No tracheal tenderness or tracheal deviation.  Cardiovascular:     Rate and Rhythm: Normal rate and regular rhythm.     Heart sounds: Normal heart sounds, S1 normal and S2 normal. No murmur heard. Pulmonary:     Effort: No respiratory distress.     Breath sounds: Normal breath sounds. No stridor. No wheezing or rales.  Lymphadenopathy:     Head:     Right side of head: No tonsillar adenopathy.     Left side of head: No tonsillar adenopathy.     Cervical: No cervical adenopathy.  Skin:    Findings: No erythema or rash.     Nails: There is no clubbing.  Neurological:     Mental Status: She is alert.    Diagnostics: Allergy skin tests were performed.   Spirometry was performed and demonstrated an FEV1 of *** @ *** % of predicted. FEV1/FVC = ***  The patient had an Asthma Control Test with the following results:  .     Assessment and Plan:    No diagnosis found.  Patient Instructions   1.  Allergen avoidance measures - peanuts  2.  Can use the following every day:   A. Cetirizine 10 mg - 1-2 tablets 1-2 times per day (MAX=40/day)  B. Famotidine 20 mg -  1 tablet 1-2 times per day   3. Review blood tests from [facility]. Further testing???  4. Epi-pen, benadryl, MD/ER evaluation for allergic reaction  5. Return to clinic in 4 weeks or earlier  if problem  6. Plan for fall flu vaccine   Jessica Priest, MD Allergy / Immunology [facility] [place]

## 2023-08-20 ENCOUNTER — Encounter: Payer: Self-pay | Admitting: Allergy and Immunology

## 2023-08-20 ENCOUNTER — Telehealth: Payer: Self-pay

## 2023-08-20 NOTE — Telephone Encounter (Signed)
-----   Message from ERIC J KOZLOW sent at 08/20/2023  6:54 AM EDT ----- 3. Review blood tests from [facility]. Further testing???  Need blood test results

## 2023-08-20 NOTE — Telephone Encounter (Signed)
Called patient - No DPR on file - LMOVM regarding provider notation below.

## 2023-08-22 NOTE — Addendum Note (Signed)
Addended by: Kellie Simmering, Navil Kole on: 08/22/2023 04:03 PM   Modules accepted: Orders

## 2023-09-16 ENCOUNTER — Ambulatory Visit (INDEPENDENT_AMBULATORY_CARE_PROVIDER_SITE_OTHER): Payer: Self-pay | Admitting: Allergy and Immunology

## 2023-09-16 ENCOUNTER — Other Ambulatory Visit: Payer: Self-pay

## 2023-09-16 ENCOUNTER — Encounter: Payer: Self-pay | Admitting: Allergy and Immunology

## 2023-09-16 VITALS — BP 124/80 | HR 93 | Temp 98.1°F | Resp 18 | Ht 64.0 in

## 2023-09-16 DIAGNOSIS — T783XXD Angioneurotic edema, subsequent encounter: Secondary | ICD-10-CM

## 2023-09-16 DIAGNOSIS — T781XXD Other adverse food reactions, not elsewhere classified, subsequent encounter: Secondary | ICD-10-CM

## 2023-09-16 DIAGNOSIS — L500 Allergic urticaria: Secondary | ICD-10-CM

## 2023-09-16 MED ORDER — FAMOTIDINE 20 MG PO TABS: 20.0000 mg | ORAL_TABLET | Freq: Two times a day (BID) | ORAL | 1 refills | Status: DC

## 2023-09-16 MED ORDER — CETIRIZINE HCL 10 MG PO TABS: 20.0000 mg | ORAL_TABLET | Freq: Two times a day (BID) | ORAL | 1 refills | Status: DC

## 2023-09-16 MED ORDER — EPINEPHRINE 0.3 MG/0.3ML IJ SOAJ: 0.3000 mg | INTRAMUSCULAR | 2 refills | Status: AC | PRN

## 2023-09-16 NOTE — Patient Instructions (Addendum)
  1.  Allergen avoidance measures - peanuts, pollens  2.  Can use the following every day:   A. Cetirizine 10 mg - 1-2 tablets 2 times per day (MAX=40/day)  B. Famotidine 20 mg -  1 tablet 2 times per day   3. Epi-pen, benadryl, MD/ER evaluation for allergic reaction  4. Blood - Tryptase, C4, alpha-gal panel, peanut components, thyroid peroxidase antibody  5. Omalizumab???

## 2023-09-16 NOTE — Progress Notes (Unsigned)
[place] - [place] - [place] - [place] -    Follow-up Note  Referring Provider: Merri Brunette, MD Primary Provider: Merri Brunette, MD Date of Office Visit: 09/16/2023  Subjective:   Brooke [name] (DOB: 11-28-1976) is a 47 y.o. female who [facility] on 09/16/2023 in re-evaluation of the following:  HPI: Brooke [name] presents to this clinic in evaluation of recurrent allergic reactions with urticaria and angioedema and possible hypersensitivity directed against peanut consumption.  I last saw her in this clinic during her initial evaluation of 19 August 2023.  She has had 2 episodes of angioedema and urticaria that last several days without any associated systemic or constitutional symptoms without any obvious provoking factor.  Allergies as of 09/16/2023       Reactions   Peanut Oil Hives   Nsaids Anxiety, Hives, Itching, Palpitations, Rash, Swelling   Tree Extract Anxiety, Hives, Itching, Rash, Swelling        Medication List    ALPRAZolam 0.25 MG tablet Commonly known as: XANAX Take 0.25 mg by mouth as needed for anxiety.   cetirizine 10 MG tablet Commonly known as: ZYRTEC Take 2 tablets (20 mg total) by mouth 2 (two) times daily.   cyclobenzaprine 10 MG tablet Commonly known as: FLEXERIL Take 10 mg by mouth 3 (three) times daily as needed for muscle spasms.   EPINEPHrine 0.3 mg/0.3 mL Soaj injection Commonly known as: EpiPen 2-Pak Inject 0.3 mg into the muscle as needed for anaphylaxis.   famotidine 20 MG tablet Commonly known as: PEPCID Take 1 tablet (20 mg total) by mouth 2 (two) times daily.   gabapentin 100 MG capsule Commonly known as: NEURONTIN Take 100 mg by mouth 2 (two) times daily.   phenazopyridine 100 MG tablet Commonly known as: Pyridium Take 1 tablet (100 mg total) by mouth 3 (three) times daily as needed for pain.   Probiotic 1-250 BILLION-MG Caps Take 1 capsule by mouth daily.   propranolol  60 MG tablet Commonly known as: INDERAL Take 60 mg by mouth daily as needed.   Vitamin D 125 MCG (5000 UT) Caps Take 1 capsule by mouth daily.    Past Medical History:  Diagnosis Date   Chronic back pain    Dysmenorrhea    Fibroid    Fibromyalgia    Headache    History of abnormal cervical Pap smear 2010   no tx   Irregular heart rhythm    takes propranolol managed by pcp no current cardiologist   STD (sexually transmitted disease)    Hx of HPV   Urticaria     Past Surgical History:  Procedure Laterality Date   BREAST BIOPSY     colonscopy  2020   CYSTOSCOPY N/A 03/20/2021   Procedure: CYSTOSCOPY;  Surgeon: Patton Salles, MD;  Location[facility];  Service: Gynecology;  Laterality: N/A;   TOTAL LAPAROSCOPIC HYSTERECTOMY WITH SALPINGECTOMY N/A 03/20/2021   Procedure: TOTAL LAPAROSCOPIC HYSTERECTOMY WITH SALPINGECTOMY WITH VAGINAL MORCELLATION OF UTERUS;  Surgeon: Patton Salles, MD;  Location: [facility];  Service: Gynecology;  Laterality: N/A;   TUBAL LIGATION  19 yrs ago    Review of systems negative except as noted in HPI / PMHx or noted below:  Review of Systems  Constitutional: Negative.   HENT: Negative.    Eyes: Negative.   Respiratory: Negative.    Cardiovascular: Negative.   Gastrointestinal: Negative.   Genitourinary: Negative.  Musculoskeletal: Negative.   Skin: Negative.   Neurological: Negative.   Endo/Heme/Allergies: Negative.   Psychiatric/Behavioral: Negative.       Objective:   Vitals:   09/16/23 1528  BP: 124/80  Pulse: 93  Resp: 18  Temp: 98.1 F (36.7 C)  SpO2: 96%   Height: 5\' 4"  (162.6 cm)      Physical Exam Constitutional:      Appearance: She is not diaphoretic.  HENT:     Head: Normocephalic.     Right Ear: Tympanic membrane, ear canal and external ear normal.     Left Ear: Tympanic membrane, ear canal and external ear normal.     Nose: Nose normal. No mucosal edema  or rhinorrhea.     Mouth/Throat:     Pharynx: Uvula midline. No oropharyngeal exudate.  Eyes:     Conjunctiva/sclera: Conjunctivae normal.  Neck:     Thyroid: No thyromegaly.     Trachea: Trachea normal. No tracheal tenderness or tracheal deviation.  Cardiovascular:     Rate and Rhythm: Normal rate and regular rhythm.     Heart sounds: Normal heart sounds, S1 normal and S2 normal. No murmur heard. Pulmonary:     Effort: No respiratory distress.     Breath sounds: Normal breath sounds. No stridor. No wheezing or rales.  Lymphadenopathy:     Head:     Right side of head: No tonsillar adenopathy.     Left side of head: No tonsillar adenopathy.     Cervical: No cervical adenopathy.  Skin:    Findings: No erythema or rash.     Nails: There is no clubbing.  Neurological:     Mental Status: She is alert.     Diagnostics: Avielle shows me results from her cell phone of blood tests taken 08 July 2023 which identifies negative ANA, negative rheumatoid factor, negative CRP, thyroid function okay, WBC 7.8, absolute eosinophil 100, hemoglobin 12.7, platelet 430, creatinine 0.73, AST 12, ALT 12.    Assessment and Plan:   1. Allergic urticaria   2. Angioedema, subsequent encounter   3. Adverse food reaction, subsequent encounter    1.  Allergen avoidance measures - peanuts, pollens  2.  Can use the following every day:   A. Cetirizine 10 mg - 1-2 tablets 2 times per day (MAX=40/day)  B. Famotidine 20 mg -  1 tablet 2 times per day   3. Epi-pen, benadryl, MD/ER evaluation for allergic reaction  4. Blood - Tryptase, C4, alpha-gal panel, peanut components, thyroid peroxidase antibody  5. Omalizumab???  Brooke [name] certainly has a hyperactive immune system with unknown etiologic factor and will obtain the blood test noted above in investigation of a systemic disease contributing to her issue.  If she has normal blood test and she continues to fail standard medical therapy we will start her on  omalizumab.  Brooke Schimke, MD Allergy / Immunology [facility]

## 2023-09-17 ENCOUNTER — Encounter: Payer: Self-pay | Admitting: Allergy and Immunology

## 2023-09-19 LAB — TRYPTASE: Tryptase: 5.3 ug/L (ref 2.2–13.2)

## 2023-09-19 LAB — THYROID PEROXIDASE ANTIBODY: Thyroperoxidase Ab SerPl-aCnc: <9 [IU]/mL (ref 0–34)

## 2023-09-19 LAB — ALPHA-GAL PANEL
Allergen Lamb IgE: <0.1 kU/L
Beef IgE: <0.1 kU/L
IgE (Immunoglobulin E), Serum: 14 [IU]/mL (ref 6–495)
O215-IgE Alpha-Gal: <0.1 kU/L
Pork IgE: <0.1 kU/L

## 2023-09-19 LAB — C4 COMPLEMENT: Complement C4, Serum: 27 mg/dL (ref 12–38)

## 2023-09-19 LAB — IGE PEANUT W/COMPONENT REFLEX: Peanut, IgE: <0.1 kU/L

## 2023-09-22 ENCOUNTER — Telehealth: Payer: Self-pay | Admitting: *Deleted

## 2023-09-22 NOTE — Telephone Encounter (Signed)
L/m for patient to reach out to me regarding Xolair and PAP

## 2023-09-22 NOTE — Telephone Encounter (Signed)
-----   Message from [facility] Idelle Crouch sent at 09/22/2023  9:11 AM EDT ----- I called patient and informed. Patient says she is interested in starting xolair but she does not have insurance. I informed I would route message to Lyan Holck.

## 2023-10-10 NOTE — Telephone Encounter (Signed)
L/m for patient again to reach out to contact me

## 2023-10-21 NOTE — Telephone Encounter (Signed)
Patient never reached back out to me

## 2024-03-07 ENCOUNTER — Other Ambulatory Visit: Payer: Self-pay | Admitting: Allergy and Immunology

## 2024-06-03 ENCOUNTER — Other Ambulatory Visit: Payer: Self-pay | Admitting: Allergy and Immunology

## 2024-08-02 ENCOUNTER — Other Ambulatory Visit: Payer: Self-pay | Admitting: Allergy and Immunology

## 2024-09-03 ENCOUNTER — Other Ambulatory Visit: Payer: Self-pay | Admitting: Allergy and Immunology

## 2024-09-03 DIAGNOSIS — L500 Allergic urticaria: Secondary | ICD-10-CM

## 2024-11-02 ENCOUNTER — Other Ambulatory Visit: Payer: Self-pay | Admitting: Allergy and Immunology

## 2024-11-29 ENCOUNTER — Other Ambulatory Visit: Payer: Self-pay | Admitting: Allergy and Immunology

## 2024-11-29 DIAGNOSIS — L500 Allergic urticaria: Secondary | ICD-10-CM
